# Patient Record
Sex: Female | Born: 1955 | Race: White | Hispanic: No | Marital: Married | Smoking: Never smoker
Health system: Southern US, Community
[De-identification: ages and names within clinical notes are randomized; demographics above are authoritative.]

## PROBLEM LIST (undated history)

## (undated) DIAGNOSIS — J45909 Unspecified asthma, uncomplicated: Secondary | ICD-10-CM

## (undated) DIAGNOSIS — R112 Nausea with vomiting, unspecified: Secondary | ICD-10-CM

## (undated) DIAGNOSIS — R0600 Dyspnea, unspecified: Secondary | ICD-10-CM

## (undated) DIAGNOSIS — T8859XA Other complications of anesthesia, initial encounter: Secondary | ICD-10-CM

## (undated) DIAGNOSIS — D649 Anemia, unspecified: Secondary | ICD-10-CM

## (undated) DIAGNOSIS — Z9889 Other specified postprocedural states: Secondary | ICD-10-CM

## (undated) DIAGNOSIS — I10 Essential (primary) hypertension: Secondary | ICD-10-CM

## (undated) DIAGNOSIS — M1990 Unspecified osteoarthritis, unspecified site: Secondary | ICD-10-CM

## (undated) HISTORY — PX: CATARACT EXTRACTION, BILATERAL: SHX1313

## (undated) HISTORY — PX: JOINT REPLACEMENT: SHX530

## (undated) HISTORY — PX: EYE SURGERY: SHX253

## (undated) HISTORY — PX: TONSILLECTOMY: SUR1361

## (undated) HISTORY — PX: ROTATOR CUFF REPAIR: SHX139

## (undated) HISTORY — PX: ABDOMINAL HYSTERECTOMY: SHX81

## (undated) HISTORY — PX: BACK SURGERY: SHX140

---

## 2021-08-15 NOTE — H&P (Signed)
  Patient's anticipated LOS is less than 2 midnights, meeting these requirements: - Younger than 7 - Lives within 1 hour of care - Has a competent adult at home to recover with post-op recover - NO history of  - Chronic pain requiring opiods  - Diabetes  - Coronary Artery Disease  - Heart failure  - Heart attack  - Stroke  - DVT/VTE  - Cardiac arrhythmia  - Respiratory Failure/COPD  - Renal failure  - Anemia  - Advanced Liver disease     Anna Wilkins is an 65 y.o. female.    Chief Complaint: right shoulder pain  HPI: Pt is a 65 y.o. female complaining of right shoulder pain for multiple years. Pain had continually increased since the beginning. X-rays in the clinic show end-stage arthritic changes of the right shoulder. Pt has tried various conservative treatments which have failed to alleviate their symptoms, including injections and therapy. Various options are discussed with the patient. Risks, benefits and expectations were discussed with the patient. Patient understand the risks, benefits and expectations and wishes to proceed with surgery.   PCP:  No primary care provider on file.  D/C Plans: Home  PMH: No past medical history on file.    Social History:  has no history on file for tobacco use, alcohol use, and drug use.  Allergies:  Not on File  Medications: No current facility-administered medications for this encounter.   No current outpatient medications on file.    No results found for this or any previous visit (from the past 48 hour(s)). No results found.  ROS: Pain with rom of the right upper extremity  Physical Exam: Alert and oriented 65 y.o. female in no acute distress Cranial nerves 2-12 intact Cervical spine: full rom with no tenderness, nv intact distally Chest: active breath sounds bilaterally, no wheeze rhonchi or rales Heart: regular rate and rhythm, no murmur Abd: non tender non distended with active bowel sounds Hip is stable with  rom  Right shoulder painful rom with crepitus  Nv intact distally No rashes or edema distally Strength 4.5/5 with ER and IR   Assessment/Plan Assessment: right shoulder end stage osteoarthritis  Plan:  Patient will undergo a right total shoulder by Dr. Ranell Patrick at [facility] Risks benefits and expectations were discussed with the patient. Patient understand risks, benefits and expectations and wishes to proceed. Preoperative templating of the joint replacement has been completed, documented, and submitted to the Operating Room personnel in order to optimize intra-operative equipment management.   Alphonsa Overall PA-C, [facility] is now [employer] [address], [place], [place] [address] Phone: [phone] [email or web address] Facebook  [employer]

## 2021-08-24 NOTE — Progress Notes (Addendum)
Surgical Instructions    Your procedure is scheduled on 08/28/21.  Report to [facility] Main Entrance "A" at 5:30 A.M., then check in with the Admitting office.  Call this number if you have problems the morning of surgery:  [phone]   If you have any questions prior to your surgery date call [phone]: Open Monday-Friday 8am-4pm    Remember:  Do not eat after midnight the night before your surgery  You may drink clear liquids until 4:30 the morning of your surgery.   Clear liquids allowed are: Water, Non-Citrus Juices (without pulp), Carbonated Beverages, Clear Tea, Black Coffee ONLY (NO MILK, CREAM OR POWDERED CREAMER of any kind), and Gatorade  Please complete your PRE-SURGERY ENSURE that was provided to you by 4:30 the morning of surgery.  Please, if able, drink it in one setting. DO NOT SIP.     Take these medicines the morning of surgery with A SIP OF WATER:  traMADol (ULTRAM)   As of today, STOP taking any Aspirin (unless otherwise instructed by your surgeon) Aleve, Naproxen, Ibuprofen, Motrin, Advil, Goody's, BC's, all herbal medications, fish oil, and all vitamins.   After your COVID test   You are not required to quarantine however you are required to wear a well-fitting mask when you are out and around people not in your household.  If your mask becomes wet or soiled, replace with a new one.  Wash your hands often with soap and water for 20 seconds or clean your hands with an alcohol-based hand sanitizer that contains at least 60% alcohol.  Do not share personal items.  Notify your provider: if you are in close contact with someone who has COVID  or if you develop a fever of 100.4 or greater, sneezing, cough, sore throat, shortness of breath or body aches.           Do not wear jewelry or makeup Do not wear lotions, powders, perfumes or deodorant. Do not shave 48 hours prior to surgery.   Do not bring valuables to the hospital. DO Not wear nail polish, gel  polish, artificial nails, or any other type of covering on natural nails including finger and toenails. If patients have artificial nails, gel coating, etc. that need to be removed by a nail salon, please have this removed prior to surgery or surgery may need to be canceled/delayed if the surgeon/ anesthesia feels like the patient is unable to be adequately monitored.             [facility] is not responsible for any belongings or valuables.  Do NOT Smoke (Tobacco/Vaping)  24 hours prior to your procedure  If you use a CPAP at night, you may bring your mask for your overnight stay.   Contacts, glasses, hearing aids, dentures or partials may not be worn into surgery, please bring cases for these belongings   For patients admitted to the hospital, discharge time will be determined by your treatment team.   Patients discharged the day of surgery will not be allowed to drive home, and someone needs to stay with them for 24 hours.  NO VISITORS WILL BE ALLOWED IN PRE-OP WHERE PATIENTS ARE PREPPED FOR SURGERY.  ONLY 1 SUPPORT PERSON MAY BE PRESENT IN THE WAITING ROOM WHILE YOU ARE IN SURGERY.  IF YOU ARE TO BE ADMITTED, ONCE YOU ARE IN YOUR ROOM YOU WILL BE ALLOWED TWO (2) VISITORS. 1 (ONE) VISITOR MAY STAY OVERNIGHT BUT MUST ARRIVE TO THE ROOM BY 8pm.  Minor children may have two parents present. Special consideration for safety and communication needs will be reviewed on a case by case basis.  Special instructions:                                               [facility]- Preparing for Total Shoulder Arthroplasty   Before surgery, you can play an important role. Because skin is not sterile, your skin needs to be as free of germs as possible. You can reduce the number of germs on your skin by using the following products. Benzoyl Peroxide Gel Reduces the number of germs present on the skin Applied twice a day to shoulder area starting two days before surgery   Chlorhexidine Gluconate (CHG)  Soap An antiseptic cleaner that kills germs and bonds with the skin to continue killing germs even after washing Used for showering the night before surgery and morning of surgery   Oral Hygiene is also important to reduce your risk of infection.                                    Remember - BRUSH YOUR TEETH THE MORNING OF SURGERY WITH YOUR REGULAR TOOTHPASTE  ==================================================================  Please follow these instructions carefully:  BENZOYL PEROXIDE 5% GEL  Please do not use if you have an allergy to benzoyl peroxide.   If your skin becomes reddened/irritated stop using the benzoyl peroxide.  Starting two days before surgery, apply as follows: Apply benzoyl peroxide in the morning and at night. Apply after taking a shower. If you are not taking a shower clean entire shoulder front, back, and side along with the armpit with a clean wet washcloth.  Place a quarter-sized dollop on your shoulder and rub in thoroughly, making sure to cover the front, back, and side of your shoulder, along with the armpit.   2 days before ____ AM   ____ PM              1 day before ____ AM   ____ PM                             Do this twice a day for two days.  (Last application is the night before surgery, AFTER using the CHG soap as described below).  Do NOT apply benzoyl peroxide gel on the day of surgery.  CHLORHEXIDINE GLUCONATE (CHG) SOAP  Please do not use if you have an allergy to CHG or antibacterial soaps. If your skin becomes reddened/irritated stop using the CHG.   Do not shave (including legs and underarms) for at least 48 hours prior to first CHG shower. It is OK to shave your face.  Starting the night before surgery, use CHG soap as follows:  Shower the NIGHT BEFORE SURGERY and MORNING OF SURGERY with CHG.  If you choose to wash your hair, wash your hair first as usual with your normal shampoo.  After shampooing, rinse your hair and body  thoroughly to remove the shampoo.  Use CHG as you would any other liquid soap.  You can apply CHG directly to the skin and wash gently with a scrungie or a clean washcloth.  Apply the CHG soap to your body ONLY FROM THE  NECK DOWN.  Do not use on open wounds or open sores.  Avoid contact with your eyes, ears, mouth, and genitals (private parts).  Wash face and genitals (private parts) with your normal soap.  Wash thoroughly, paying special attention to the area where your surgery will be performed.  Thoroughly rinse your body with warm water from the neck down.  DO NOT shower/wash with your normal soap after using and rinsing off the CHG soap.   Pat yourself dry with a CLEAN TOWEL.    Apply benzoyl peroxide.   Wear CLEAN PAJAMAS to bed the night before surgery; wear comfortable clothes the morning of surgery.  Place CLEAN SHEETS on your bed the night of your first shower and DO NOT SLEEP WITH PETS.                       Day of Surgery: Shower as above Do not apply any deodorants/lotions.  Please wear clean clothes to the hospital/surgery center.   Remember to brush your teeth WITH YOUR REGULAR TOOTHPASTE.      Please read over the following fact sheets that you were given.

## 2021-08-25 ENCOUNTER — Encounter (HOSPITAL_COMMUNITY): Payer: Self-pay

## 2021-08-25 ENCOUNTER — Encounter (HOSPITAL_COMMUNITY)
Admission: RE | Admit: 2021-08-25 | Discharge: 2021-08-25 | Disposition: A | Discharge status: Home / Self Care | Payer: No Typology Code available for payment source | Source: Ambulatory Visit | Attending: Orthopedic Surgery | Admitting: Orthopedic Surgery

## 2021-08-25 ENCOUNTER — Other Ambulatory Visit: Payer: Self-pay

## 2021-08-25 DIAGNOSIS — Z20822 Contact with and (suspected) exposure to covid-19: Secondary | ICD-10-CM | POA: Insufficient documentation

## 2021-08-25 DIAGNOSIS — Z01818 Encounter for other preprocedural examination: Secondary | ICD-10-CM | POA: Diagnosis not present

## 2021-08-25 HISTORY — DX: Dyspnea, unspecified: R06.00

## 2021-08-25 HISTORY — DX: Unspecified osteoarthritis, unspecified site: M19.90

## 2021-08-25 HISTORY — DX: Other complications of anesthesia, initial encounter: T88.59XA

## 2021-08-25 HISTORY — DX: Essential (primary) hypertension: I10

## 2021-08-25 HISTORY — DX: Unspecified asthma, uncomplicated: J45.909

## 2021-08-25 HISTORY — DX: Other specified postprocedural states: Z98.890

## 2021-08-25 HISTORY — DX: Other specified postprocedural states: R11.2

## 2021-08-25 LAB — CBC
HCT: 45.8 % (ref 36.0–46.0)
Hemoglobin: 14.8 g/dL (ref 12.0–15.0)
MCH: 31.7 pg (ref 26.0–34.0)
MCHC: 32.3 g/dL (ref 30.0–36.0)
MCV: 98.1 fL (ref 80.0–100.0)
Platelets: 217 10*3/uL (ref 150–400)
RBC: 4.67 MIL/uL (ref 3.87–5.11)
RDW: 13.2 % (ref 11.5–15.5)
WBC: 9.3 10*3/uL (ref 4.0–10.5)
nRBC: 0 % (ref 0.0–0.2)

## 2021-08-25 LAB — SURGICAL PCR SCREEN
MRSA, PCR: NEGATIVE
Staphylococcus aureus: NEGATIVE

## 2021-08-25 LAB — BASIC METABOLIC PANEL
Anion gap: 7 (ref 5–15)
BUN: 14 mg/dL (ref 8–23)
CO2: 27 mmol/L (ref 22–32)
Calcium: 9.2 mg/dL (ref 8.9–10.3)
Chloride: 104 mmol/L (ref 98–111)
Creatinine, Ser: 0.68 mg/dL (ref 0.44–1.00)
GFR, Estimated: >60 mL/min (ref >60)
Glucose, Bld: 102 mg/dL — ABNORMAL HIGH (ref 70–99)
Potassium: 3.6 mmol/L (ref 3.5–5.1)
Sodium: 138 mmol/L (ref 135–145)

## 2021-08-25 LAB — SARS CORONAVIRUS 2 (TAT 6-24 HRS): SARS Coronavirus 2: NEGATIVE

## 2021-08-25 NOTE — Progress Notes (Addendum)
PCP: Ardelle Balls, MD Cardiologist: denies  EKG: 08/25/21 CXR: na ECHO: denies Stress Test: denies Cardiac Cath: denies  Fasting Blood Sugar- na Checks Blood Sugar__na_ times a day  ASA/Blood Thinner: No  OSA/CPAP: No  Covid test 08/25/21 at PAT  Anesthesia Review: Yes, abnormal EKG  Patient denies shortness of breath, fever, cough, and chest pain at PAT appointment.  Patient verbalized understanding of instructions provided today at the PAT appointment.  Patient asked to review instructions at home and day of surgery.

## 2021-08-27 NOTE — Anesthesia Preprocedure Evaluation (Addendum)
Anesthesia Evaluation  Patient identified by MRN, date of birth, ID band Patient awake    Reviewed: Allergy & Precautions, H&P , NPO status , Patient's Chart, lab work & pertinent test results  History of Anesthesia Complications (+) PONV  Airway Mallampati: II  TM Distance: >3 FB Neck ROM: Full    Dental no notable dental hx. (+) Teeth Intact, Dental Advisory Given   Pulmonary asthma ,    Pulmonary exam normal breath sounds clear to auscultation       Cardiovascular Exercise Tolerance: Good hypertension,  Rhythm:Regular Rate:Normal     Neuro/Psych negative neurological ROS  negative psych ROS   GI/Hepatic negative GI ROS, Neg liver ROS,   Endo/Other  negative endocrine ROS  Renal/GU negative Renal ROS  negative genitourinary   Musculoskeletal  (+) Arthritis , Osteoarthritis,    Abdominal   Peds  Hematology negative hematology ROS (+)   Anesthesia Other Findings   Reproductive/Obstetrics negative OB ROS                            Anesthesia Physical Anesthesia Plan  ASA: 2  Anesthesia Plan: General   Post-op Pain Management:  Regional for Post-op pain   Induction: Intravenous  PONV Risk Score and Plan: 4 or greater and Ondansetron, Dexamethasone, Midazolam and Droperidol  Airway Management Planned: Oral ETT  Additional Equipment:   Intra-op Plan:   Post-operative Plan: Extubation in OR  Informed Consent: I have reviewed the patients History and Physical, chart, labs and discussed the procedure including the risks, benefits and alternatives for the proposed anesthesia with the patient or authorized representative who has indicated his/her understanding and acceptance.     Dental advisory given  Plan Discussed with: CRNA  Anesthesia Plan Comments:        Anesthesia Quick Evaluation

## 2021-08-28 ENCOUNTER — Observation Stay (HOSPITAL_COMMUNITY)
Admission: RE | Admit: 2021-08-28 | Discharge: 2021-08-29 | Disposition: A | Discharge status: Home / Self Care | Payer: No Typology Code available for payment source | Attending: Orthopedic Surgery | Admitting: Orthopedic Surgery

## 2021-08-28 ENCOUNTER — Ambulatory Visit (HOSPITAL_COMMUNITY): Payer: No Typology Code available for payment source | Admitting: Anesthesiology

## 2021-08-28 ENCOUNTER — Other Ambulatory Visit: Payer: Self-pay

## 2021-08-28 ENCOUNTER — Encounter (HOSPITAL_COMMUNITY): Payer: Self-pay | Admitting: Orthopedic Surgery

## 2021-08-28 ENCOUNTER — Encounter (HOSPITAL_COMMUNITY)
Admission: RE | Disposition: A | Discharge status: Home / Self Care | Payer: Self-pay | Source: Home / Self Care | Attending: Orthopedic Surgery

## 2021-08-28 ENCOUNTER — Observation Stay (HOSPITAL_COMMUNITY): Payer: No Typology Code available for payment source

## 2021-08-28 DIAGNOSIS — I10 Essential (primary) hypertension: Secondary | ICD-10-CM | POA: Insufficient documentation

## 2021-08-28 DIAGNOSIS — Z96611 Presence of right artificial shoulder joint: Secondary | ICD-10-CM

## 2021-08-28 DIAGNOSIS — M19011 Primary osteoarthritis, right shoulder: Principal | ICD-10-CM | POA: Insufficient documentation

## 2021-08-28 HISTORY — PX: TOTAL SHOULDER ARTHROPLASTY: SHX126

## 2021-08-28 SURGERY — ARTHROPLASTY, SHOULDER, TOTAL
Anesthesia: General | Site: Shoulder | Laterality: Right

## 2021-08-28 MED ORDER — CEFAZOLIN SODIUM-DEXTROSE 2-3 GM-%(50ML) IV SOLR
INTRAVENOUS | Status: DC | PRN
  Administered 2021-08-28: 2 g via INTRAVENOUS

## 2021-08-28 MED ORDER — LIDOCAINE 2% (20 MG/ML) 5 ML SYRINGE
INTRAMUSCULAR | Status: DC | PRN
Start: 2021-08-28 — End: 2021-08-28
  Administered 2021-08-28: 40 mg via INTRAVENOUS

## 2021-08-28 MED ORDER — FENTANYL CITRATE (PF) 100 MCG/2ML IJ SOLN: 25.0000 ug | INTRAMUSCULAR | Status: DC | PRN

## 2021-08-28 MED ORDER — ROCURONIUM BROMIDE 10 MG/ML (PF) SYRINGE
PREFILLED_SYRINGE | INTRAVENOUS | Status: DC | PRN
  Administered 2021-08-28: 50 mg via INTRAVENOUS

## 2021-08-28 MED ORDER — MORPHINE SULFATE (PF) 2 MG/ML IV SOLN: 0.5000 mg | INTRAVENOUS | Status: DC | PRN

## 2021-08-28 MED ORDER — 0.9 % SODIUM CHLORIDE (POUR BTL) OPTIME
TOPICAL | Status: DC | PRN
  Administered 2021-08-28: 1000 mL

## 2021-08-28 MED ORDER — METHOCARBAMOL 500 MG PO TABS: 500.0000 mg | ORAL_TABLET | Freq: Four times a day (QID) | ORAL | Status: DC | PRN

## 2021-08-28 MED ORDER — METOCLOPRAMIDE HCL 5 MG/ML IJ SOLN: 5.0000 mg | Freq: Three times a day (TID) | INTRAMUSCULAR | Status: DC | PRN

## 2021-08-28 MED ORDER — POLYETHYLENE GLYCOL 3350 17 G PO PACK: 17.0000 g | PACK | Freq: Every day | ORAL | Status: DC | PRN

## 2021-08-28 MED ORDER — DEXAMETHASONE SODIUM PHOSPHATE 10 MG/ML IJ SOLN
INTRAMUSCULAR | Status: DC | PRN
  Administered 2021-08-28: 10 mg via INTRAVENOUS

## 2021-08-28 MED ORDER — TRAMADOL HCL 50 MG PO TABS
50.0000 mg | ORAL_TABLET | Freq: Two times a day (BID) | ORAL | Status: DC
Start: 2021-08-28 — End: 2021-08-29
  Administered 2021-08-28: 50 mg via ORAL
  Filled 2021-08-28: qty 1

## 2021-08-28 MED ORDER — DOCUSATE SODIUM 100 MG PO CAPS
100.0000 mg | ORAL_CAPSULE | Freq: Two times a day (BID) | ORAL | Status: DC
  Administered 2021-08-28: 100 mg via ORAL
  Filled 2021-08-28: qty 1

## 2021-08-28 MED ORDER — ACETAMINOPHEN 500 MG PO TABS
ORAL_TABLET | ORAL | Status: AC
  Administered 2021-08-28: 1000 mg via ORAL
  Filled 2021-08-28: qty 2

## 2021-08-28 MED ORDER — LACTATED RINGERS IV SOLN: INTRAVENOUS | Status: DC

## 2021-08-28 MED ORDER — IBUPROFEN 800 MG PO TABS
800.0000 mg | ORAL_TABLET | Freq: Two times a day (BID) | ORAL | Status: DC
  Administered 2021-08-28: 800 mg via ORAL
  Filled 2021-08-28: qty 1

## 2021-08-28 MED ORDER — HYDROCODONE-ACETAMINOPHEN 5-325 MG PO TABS: 1.0000 | ORAL_TABLET | Freq: Four times a day (QID) | ORAL | 0 refills | Status: DC | PRN

## 2021-08-28 MED ORDER — FOLIC ACID 1 MG PO TABS: 1.0000 mg | ORAL_TABLET | Freq: Every day | ORAL | Status: DC

## 2021-08-28 MED ORDER — ONDANSETRON HCL 4 MG PO TABS: 4.0000 mg | ORAL_TABLET | Freq: Four times a day (QID) | ORAL | Status: DC | PRN

## 2021-08-28 MED ORDER — FENTANYL CITRATE (PF) 250 MCG/5ML IJ SOLN
INTRAMUSCULAR | Status: AC
  Filled 2021-08-28: qty 5

## 2021-08-28 MED ORDER — ONDANSETRON HCL 4 MG/2ML IJ SOLN: 4.0000 mg | Freq: Four times a day (QID) | INTRAMUSCULAR | Status: DC | PRN

## 2021-08-28 MED ORDER — SODIUM CHLORIDE 0.9 % IV SOLN: INTRAVENOUS | Status: DC

## 2021-08-28 MED ORDER — ACETAMINOPHEN 325 MG PO TABS: 325.0000 mg | ORAL_TABLET | Freq: Four times a day (QID) | ORAL | Status: DC | PRN

## 2021-08-28 MED ORDER — METHOCARBAMOL 500 MG PO TABS: 500.0000 mg | ORAL_TABLET | Freq: Three times a day (TID) | ORAL | 1 refills | Status: DC | PRN

## 2021-08-28 MED ORDER — MIDAZOLAM HCL 2 MG/2ML IJ SOLN
INTRAMUSCULAR | Status: DC | PRN
  Administered 2021-08-28 (×2): 1 mg via INTRAVENOUS

## 2021-08-28 MED ORDER — HYDROCODONE-ACETAMINOPHEN 5-325 MG PO TABS
1.0000 | ORAL_TABLET | ORAL | Status: DC | PRN
  Administered 2021-08-29: 1 via ORAL
  Filled 2021-08-28: qty 1

## 2021-08-28 MED ORDER — CEFAZOLIN SODIUM-DEXTROSE 2-4 GM/100ML-% IV SOLN
2.0000 g | Freq: Four times a day (QID) | INTRAVENOUS | Status: AC
  Administered 2021-08-28 – 2021-08-29 (×3): 2 g via INTRAVENOUS
  Filled 2021-08-28 (×3): qty 100

## 2021-08-28 MED ORDER — MIDAZOLAM HCL 2 MG/2ML IJ SOLN
INTRAMUSCULAR | Status: AC
  Filled 2021-08-28: qty 2

## 2021-08-28 MED ORDER — BUPIVACAINE-EPINEPHRINE 0.25% -1:200000 IJ SOLN
INTRAMUSCULAR | Status: DC | PRN
  Administered 2021-08-28: 10 mL

## 2021-08-28 MED ORDER — PHENOL 1.4 % MT LIQD: 1.0000 | OROMUCOSAL | Status: DC | PRN

## 2021-08-28 MED ORDER — ONDANSETRON HCL 4 MG PO TABS: 4.0000 mg | ORAL_TABLET | Freq: Three times a day (TID) | ORAL | 0 refills | Status: DC | PRN

## 2021-08-28 MED ORDER — ONDANSETRON HCL 4 MG/2ML IJ SOLN
INTRAMUSCULAR | Status: DC | PRN
  Administered 2021-08-28: 4 mg via INTRAVENOUS

## 2021-08-28 MED ORDER — CEFAZOLIN SODIUM-DEXTROSE 2-4 GM/100ML-% IV SOLN
INTRAVENOUS | Status: AC
  Filled 2021-08-28: qty 100

## 2021-08-28 MED ORDER — THROMBIN 5000 UNITS EX SOLR
CUTANEOUS | Status: AC
  Filled 2021-08-28: qty 5000

## 2021-08-28 MED ORDER — BISACODYL 10 MG RE SUPP: 10.0000 mg | Freq: Every day | RECTAL | Status: DC | PRN

## 2021-08-28 MED ORDER — EPHEDRINE SULFATE-NACL 50-0.9 MG/10ML-% IV SOSY
PREFILLED_SYRINGE | INTRAVENOUS | Status: DC | PRN
  Administered 2021-08-28 (×4): 5 mg via INTRAVENOUS

## 2021-08-28 MED ORDER — BUPIVACAINE-EPINEPHRINE (PF) 0.5% -1:200000 IJ SOLN
INTRAMUSCULAR | Status: DC | PRN
Start: 2021-08-28 — End: 2021-08-28
  Administered 2021-08-28: 15 mL via PERINEURAL

## 2021-08-28 MED ORDER — ACETAMINOPHEN 500 MG PO TABS: 1000.0000 mg | ORAL_TABLET | Freq: Once | ORAL | Status: AC

## 2021-08-28 MED ORDER — BUPIVACAINE LIPOSOME 1.3 % IJ SUSP
INTRAMUSCULAR | Status: DC | PRN
  Administered 2021-08-28: 10 mL via PERINEURAL

## 2021-08-28 MED ORDER — PROPOFOL 10 MG/ML IV BOLUS
INTRAVENOUS | Status: DC | PRN
  Administered 2021-08-28: 120 mg via INTRAVENOUS
  Administered 2021-08-28: 10 mg via INTRAVENOUS

## 2021-08-28 MED ORDER — METOCLOPRAMIDE HCL 5 MG PO TABS: 5.0000 mg | ORAL_TABLET | Freq: Three times a day (TID) | ORAL | Status: DC | PRN

## 2021-08-28 MED ORDER — ORAL CARE MOUTH RINSE: 15.0000 mL | Freq: Once | OROMUCOSAL | Status: AC

## 2021-08-28 MED ORDER — MENTHOL 3 MG MT LOZG: 1.0000 | LOZENGE | OROMUCOSAL | Status: DC | PRN

## 2021-08-28 MED ORDER — BUPIVACAINE-EPINEPHRINE (PF) 0.25% -1:200000 IJ SOLN
INTRAMUSCULAR | Status: AC
  Filled 2021-08-28: qty 30

## 2021-08-28 MED ORDER — DROPERIDOL 2.5 MG/ML IJ SOLN
INTRAMUSCULAR | Status: DC | PRN
  Administered 2021-08-28: .625 mg via INTRAVENOUS

## 2021-08-28 MED ORDER — PHENYLEPHRINE 40 MCG/ML (10ML) SYRINGE FOR IV PUSH (FOR BLOOD PRESSURE SUPPORT)
PREFILLED_SYRINGE | INTRAVENOUS | Status: DC | PRN
  Administered 2021-08-28: 80 ug via INTRAVENOUS
  Administered 2021-08-28: 120 ug via INTRAVENOUS

## 2021-08-28 MED ORDER — METHOCARBAMOL 1000 MG/10ML IJ SOLN
500.0000 mg | Freq: Four times a day (QID) | INTRAVENOUS | Status: DC | PRN
  Filled 2021-08-28: qty 5

## 2021-08-28 MED ORDER — CHLORHEXIDINE GLUCONATE 0.12 % MT SOLN: 15.0000 mL | Freq: Once | OROMUCOSAL | Status: AC

## 2021-08-28 MED ORDER — SUGAMMADEX SODIUM 200 MG/2ML IV SOLN
INTRAVENOUS | Status: DC | PRN
  Administered 2021-08-28: 200 mg via INTRAVENOUS

## 2021-08-28 MED ORDER — CHLORHEXIDINE GLUCONATE 0.12 % MT SOLN
OROMUCOSAL | Status: AC
  Administered 2021-08-28: 15 mL via OROMUCOSAL
  Filled 2021-08-28: qty 15

## 2021-08-28 MED ORDER — FENTANYL CITRATE (PF) 250 MCG/5ML IJ SOLN
INTRAMUSCULAR | Status: DC | PRN
  Administered 2021-08-28 (×2): 50 ug via INTRAVENOUS

## 2021-08-28 MED ORDER — PHENYLEPHRINE HCL-NACL 20-0.9 MG/250ML-% IV SOLN
INTRAVENOUS | Status: DC | PRN
  Administered 2021-08-28: 40 ug/min via INTRAVENOUS

## 2021-08-28 MED ORDER — PROPOFOL 10 MG/ML IV BOLUS
INTRAVENOUS | Status: AC
  Filled 2021-08-28: qty 40

## 2021-08-28 SURGICAL SUPPLY — 73 items
BAG COUNTER SPONGE SURGICOUNT (BAG) ×2 IMPLANT
BIT DRILL 170X2.5X (BIT) ×1 IMPLANT
BIT DRILL 5/64X5 DISP (BIT) ×2 IMPLANT
BIT DRL 170X2.5X (BIT) ×1
BLADE SAW SAG 73X25 THK (BLADE) ×1
BLADE SAW SGTL 73X25 THK (BLADE) ×1 IMPLANT
COVER SURGICAL LIGHT HANDLE (MISCELLANEOUS) ×2 IMPLANT
DRAPE INCISE IOBAN 66X45 STRL (DRAPES) ×2 IMPLANT
DRAPE ORTHO SPLIT 77X108 STRL (DRAPES) ×2
DRAPE SURG ORHT 6 SPLT 77X108 (DRAPES) ×2 IMPLANT
DRAPE U-SHAPE 47X51 STRL (DRAPES) ×2 IMPLANT
DRILL 2.5 (BIT) ×1
DRSG ADAPTIC 3X8 NADH LF (GAUZE/BANDAGES/DRESSINGS) ×2 IMPLANT
DRSG PAD ABDOMINAL 8X10 ST (GAUZE/BANDAGES/DRESSINGS) ×2 IMPLANT
DURAPREP 26ML APPLICATOR (WOUND CARE) ×2 IMPLANT
ECCENTRIC EPIPHYSI MODULAR SZ1 (Trauma) ×1 IMPLANT
ELECT BLADE 4.0 EZ CLEAN MEGAD (MISCELLANEOUS) ×2
ELECT NEEDLE TIP 2.8 STRL (NEEDLE) ×2 IMPLANT
ELECT REM PT RETURN 9FT ADLT (ELECTROSURGICAL) ×2
ELECTRODE BLDE 4.0 EZ CLN MEGD (MISCELLANEOUS) ×1 IMPLANT
ELECTRODE REM PT RTRN 9FT ADLT (ELECTROSURGICAL) ×1 IMPLANT
GAUZE SPONGE 4X4 12PLY STRL (GAUZE/BANDAGES/DRESSINGS) ×2 IMPLANT
GLENOSPHERE DELTA XTEND LAT 38 (Miscellaneous) ×2 IMPLANT
GLOVE SURG ORTHO LTX SZ7.5 (GLOVE) ×2 IMPLANT
GLOVE SURG ORTHO LTX SZ8.5 (GLOVE) ×2 IMPLANT
GLOVE SURG POLY ORTHO LF SZ7.5 (GLOVE) ×2 IMPLANT
GLOVE SURG POLY ORTHO LF SZ8 (GLOVE) ×2 IMPLANT
GOWN STRL REUS W/ TWL XL LVL3 (GOWN DISPOSABLE) ×2 IMPLANT
GOWN STRL REUS W/TWL XL LVL3 (GOWN DISPOSABLE) ×2
KIT BASIN OR (CUSTOM PROCEDURE TRAY) ×2 IMPLANT
KIT TURNOVER KIT B (KITS) ×2 IMPLANT
MANIFOLD NEPTUNE II (INSTRUMENTS) ×2 IMPLANT
METAGLENE DELTA EXTEND (Trauma) ×1 IMPLANT
METAGLENE DXTEND (Trauma) ×2 IMPLANT
MODULAR ECCENTRIC EPIPHYSI SZ1 (Trauma) ×2 IMPLANT
NDL SUT .5 MAYO 1.404X.05X (NEEDLE) ×1 IMPLANT
NDL SUT 6 .5 CRC .975X.05 MAYO (NEEDLE) IMPLANT
NEEDLE 1/2 CIR MAYO (NEEDLE) IMPLANT
NEEDLE HYPO 25GX1X1/2 BEV (NEEDLE) ×2 IMPLANT
NEEDLE MAYO TAPER (NEEDLE) ×1
NS IRRIG 1000ML POUR BTL (IV SOLUTION) ×2 IMPLANT
PACK SHOULDER (CUSTOM PROCEDURE TRAY) ×2 IMPLANT
PAD ARMBOARD 7.5X6 YLW CONV (MISCELLANEOUS) ×2 IMPLANT
PIN GUIDE 1.2 (PIN) ×2 IMPLANT
PIN GUIDE GLENOPHERE 1.5MX300M (PIN) ×2 IMPLANT
PIN METAGLENE 2.5 (PIN) ×2 IMPLANT
SCREW 4.5X36MM (Screw) ×2 IMPLANT
SCREW LOCK 42 (Screw) ×2 IMPLANT
SLING ARM FOAM STRAP LRG (SOFTGOODS) ×2 IMPLANT
SMARTMIX MINI TOWER (MISCELLANEOUS)
SPACER 38 PLUS 3 (Spacer) ×2 IMPLANT
SPONGE SURGIFOAM ABS GEL SZ50 (HEMOSTASIS) IMPLANT
SPONGE T-LAP 18X18 ~~LOC~~+RFID (SPONGE) ×2 IMPLANT
SPONGE T-LAP 4X18 ~~LOC~~+RFID (SPONGE) ×4 IMPLANT
STEM HUMERAL SZ8 STANDARD (Stem) ×2 IMPLANT
STEM HUMERAL SZ8 STD (Stem) ×1 IMPLANT
STRIP CLOSURE SKIN 1/2X4 (GAUZE/BANDAGES/DRESSINGS) ×2 IMPLANT
SUCTION FRAZIER HANDLE 10FR (MISCELLANEOUS) ×1
SUCTION TUBE FRAZIER 10FR DISP (MISCELLANEOUS) ×1 IMPLANT
SUT FIBERWIRE #2 38 T-5 BLUE (SUTURE) ×8
SUT MNCRL AB 4-0 PS2 18 (SUTURE) ×2 IMPLANT
SUT VIC AB 0 CT1 27 (SUTURE) ×1
SUT VIC AB 0 CT1 27XBRD ANBCTR (SUTURE) ×1 IMPLANT
SUT VIC AB 0 CT2 27 (SUTURE) ×2 IMPLANT
SUT VIC AB 2-0 CT1 27 (SUTURE) ×2
SUT VIC AB 2-0 CT1 TAPERPNT 27 (SUTURE) ×2 IMPLANT
SUT VICRYL AB 2 0 TIES (SUTURE) IMPLANT
SUTURE FIBERWR #2 38 T-5 BLUE (SUTURE) ×4 IMPLANT
SYR CONTROL 10ML LL (SYRINGE) ×2 IMPLANT
TOWEL GREEN STERILE (TOWEL DISPOSABLE) ×2 IMPLANT
TOWEL GREEN STERILE FF (TOWEL DISPOSABLE) ×2 IMPLANT
TOWER SMARTMIX MINI (MISCELLANEOUS) IMPLANT
YANKAUER SUCT BULB TIP NO VENT (SUCTIONS) ×2 IMPLANT

## 2021-08-28 NOTE — Op Note (Signed)
NAME: Anna Wilkins, Anna Wilkins MEDICAL RECORD NO: [id number] ACCOUNT NO: [id number] DATE OF BIRTH: 04-16-1956 FACILITY: [facility] LOCATION: [facility] PHYSICIAN: Almedia Balls. Ranell Patrick, MD  Operative Report   DATE OF PROCEDURE: 08/28/2021  PREOPERATIVE DIAGNOSIS:  Right shoulder end-stage arthritis.  POSTOPERATIVE DIAGNOSES:  Right shoulder end-stage arthritis, rotator cuff insufficiency.  PROCEDURE PERFORMED:  Right reverse shoulder replacement using DePuy Delta Xtend prosthesis with subscapularis repair.  ATTENDING SURGEON:  Almedia Balls. Ranell Patrick, MD  ASSISTANT:  Konrad Felix Dixon, [place], who was scrubbed during the entire procedure and necessary for satisfactory completion of surgery.  ANESTHESIA:  General anesthesia was used plus interscalene block.  ESTIMATED BLOOD LOSS:  200 mL.  FLUID REPLACEMENT:  1300 mL of crystalloid.  COUNTS:  Instrument counts were correct.  COMPLICATIONS:  None.  ANTIBIOTICS:  Perioperative antibiotics were given.  INDICATIONS:  The patient is a 65 year old female with a history of prior rotator cuff surgery, presents with progressive shoulder arthritis and pain.  The patient is now bone-on-bone and has extreme synovitis in the shoulder joint on MRI scan.  The  rotator cuff is intact, but of unknown quality.  We discussed options with the patient to include anatomic versus reverse shoulder replacement.  The patient presents for arthroplasty to restore function and eliminate pain.  Informed consent was obtained.  DESCRIPTION OF PROCEDURE:  After an adequate level of anesthesia was achieved, the patient was positioned in the modified beach chair position.  Right shoulder was correctly identified and sterilely prepped and draped in the usual manner.  Timeout was  called verifying correct patient, correct site.  We entered the patient's shoulder using a standard deltopectoral approach starting at the coracoid process and extending down to the anterior humerus.  Dissection down through  subcutaneous tissues using  Bovie.  We identified the cephalic vein and took that laterally with the deltoid.  The pectoralis was taken medially.  Conjoined tendon was identified and retracted medially.  Biceps was tenodesed in situ with 0 Vicryl figure-of-eight suture.  We then  released the subscapularis off the lesser tuberosity.  This was extensively scarred and fairly thin.  We did tag for protection of the axillary nerve.  We then released the inferior capsule.  A decision was made at this time given poor excursion on the  supraspinatus and also the poor condition of the subscapularis to proceed with reverse shoulder placement to provide more sure fixed fulcrum mechanics postoperatively, which will give her good pain relief, which is her primary complaint.  Once we  released the inferior capsule, we extended the shoulder delivering the humeral head out of the wound.  We entered the proximal humerus with a 6 mm reamer, reamed up to a size 8.  The patient had no cartilage on the humeral head.  We then resected the  head at 20 degrees of retroversion with the oscillating saw. We then removed excess osteophytes with a rongeur.  We subluxed the humerus posteriorly gaining good exposure of glenoid face, which also was devoid of cartilage.  We performed our capsular  excision and labrum excision.  The biceps had previously been tenodesed.  At this point, we found our center point for guide pin, which we placed our guide pin under power.  We then did our reaming for the metaglene baseplate.  We then drilled our  central peg hole and impacted the metaglene into position, placed a 42 screw inferiorly and 36 at the base of the coracoid.  Because of the small size of the patient's native  glenoid, we could only get 2 screws in.  We then took 38 standard glenosphere  and placed it on the baseplate and secured it.  I did a finger sweep to make sure no soft tissue was caught up in the bearing surface.  We then went to  the humeral side and prepared the metaphysis with a reamer for the 1 right metaphysis.  We then  inserted the 8 stem with 1 right metaphysis set on the 0 setting and placed in 20 degrees of retroversion.  We then trialed with a 38+3 poly trial.  We were happy with our soft tissue balancing and stability.  We removed all trial components.  We then  irrigated thoroughly the humeral canal. We used available bone graft from the humeral head and used impaction grafting technique to insert the Porocoat 8 stem and the HA coated 1 right metaphysis, again set on the 0 setting and impacted in 20 degrees of  retroversion.  We had also previously drilled holes in the lesser tuberosity and placed #2 FiberWire for repair of the subscap.  Once we had the stem in place and stable, we went ahead and selected the real 38+3 poly and impacted that in position.  We  then reduced the shoulder.  We were again pleased with our soft tissue balancing, which mirrored our trial.  We had no impingement and excellent excursion.  We then repaired the subscapularis anatomically back to bone.  Once we had that secured, we  trialed the shoulder and no restriction in range of motion.  At this point, we irrigated again and repaired the deltopectoral interval with 0 Vicryl suture followed by 2-0 Vicryl for subcutaneous closure and 4-0 Monocryl for skin.  Steri-Strips were  applied followed by sterile dressing.  The patient tolerated the surgery well.   [facility] D: 08/28/2021 10:00:34 am T: 08/28/2021 11:44:00 am  JOB: [id number]/ [id number]

## 2021-08-28 NOTE — Anesthesia Procedure Notes (Signed)
Procedure Name: Intubation Date/Time: 08/28/2021 7:34 AM Performed by: Betha Loa, CRNA Pre-anesthesia Checklist: Patient identified, Emergency Drugs available, Suction available and Patient being monitored Patient Re-evaluated:Patient Re-evaluated prior to induction Oxygen Delivery Method: Circle System Utilized Preoxygenation: Pre-oxygenation with 100% oxygen Induction Type: IV induction Ventilation: Mask ventilation without difficulty Laryngoscope Size: Mac and 3 Grade View: Grade I Tube type: Oral Tube size: 7.0 mm Number of attempts: 1 Airway Equipment and Method: Stylet and Oral airway Placement Confirmation: ETT inserted through vocal cords under direct vision, positive ETCO2 and breath sounds checked- equal and bilateral Secured at: 21 cm Tube secured with: Tape Dental Injury: Teeth and Oropharynx as per pre-operative assessment

## 2021-08-28 NOTE — Discharge Instructions (Signed)
Ice to the shoulder constantly.  Keep the incision covered and clean and dry for one week, then ok to get it wet in the shower.  Do exercise as instructed several times per day.  DO NOT reach behind your back or push up out of a chair with the operative arm.  Use a sling while you are up and around for comfort, may remove while seated.  Keep pillow propped behind the operative elbow.  Follow up with Dr  in two weeks in the office, call [phone] for appt Ice to the shoulder constantly.  Keep the incision covered and clean and dry for one week, then ok to get it wet in the shower.  

## 2021-08-28 NOTE — Transfer of Care (Signed)
Immediate Anesthesia Transfer of Care Note  Patient: Anna Wilkins  Procedure(s) Performed: REVERSE TOTAL SHOULDER ARTHROPLASTY (Right: Shoulder)  Patient Location: PACU  Anesthesia Type:General and Regional  Level of Consciousness: patient cooperative and responds to stimulation  Airway & Oxygen Therapy: Patient Spontanous Breathing and Patient connected to nasal cannula oxygen  Post-op Assessment: Report given to RN and Post -op Vital signs reviewed and stable  Post vital signs: Reviewed and stable  Last Vitals:  Vitals Value Taken Time  BP 131/76 08/28/21 0951  Temp 36.2 C 08/28/21 0951  Pulse 103 08/28/21 0954  Resp 17 08/28/21 0954  SpO2 96 % 08/28/21 0954  Vitals shown include unvalidated device data.  Last Pain:  Vitals:   08/28/21 0951  TempSrc:   PainSc: Asleep         Complications: No notable events documented.

## 2021-08-28 NOTE — Anesthesia Procedure Notes (Signed)
Anesthesia Regional Block: Interscalene brachial plexus block   Pre-Anesthetic Checklist: , timeout performed,  Correct Patient, Correct Site, Correct Laterality,  Correct Procedure, Correct Position, site marked,  Risks and benefits discussed,  Pre-op evaluation,  At surgeon's request and post-op pain management  Laterality: Right  Prep: Maximum Sterile Barrier Precautions used, chloraprep       Needles:  Injection technique: Single-shot  Needle Type: Echogenic Stimulator Needle     Needle Length: 5cm  Needle Gauge: 22     Additional Needles:   Procedures:,,,, ultrasound used (permanent image in chart),,    Narrative:  Start time: 08/28/2021 6:56 AM End time: 08/28/2021 7:06 AM Injection made incrementally with aspirations every 5 mL.  Performed by: Personally  Anesthesiologist: Gaynelle Adu, MD  Additional Notes: 2% Lidocaine skin wheel.

## 2021-08-28 NOTE — Anesthesia Postprocedure Evaluation (Signed)
Anesthesia Post Note  Patient: Tamesha Ellerbrock  Procedure(s) Performed: REVERSE TOTAL SHOULDER ARTHROPLASTY (Right: Shoulder)     Patient location during evaluation: PACU Anesthesia Type: General and Regional Level of consciousness: awake and alert Pain management: pain level controlled Vital Signs Assessment: post-procedure vital signs reviewed and stable Respiratory status: spontaneous breathing, nonlabored ventilation and respiratory function stable Cardiovascular status: blood pressure returned to baseline and stable Postop Assessment: no apparent nausea or vomiting Anesthetic complications: no   No notable events documented.  Last Vitals:  Vitals:   08/28/21 1055 08/28/21 1106  BP: 130/85 115/76  Pulse: (!) 109 99  Resp: 16 18  Temp: 36.7 C 36.6 C  SpO2: 93% 94%    Last Pain:  Vitals:   08/28/21 1106  TempSrc: Oral  PainSc:                  ,W. EDMOND

## 2021-08-28 NOTE — Brief Op Note (Signed)
08/28/2021  9:54 AM  PATIENT:  Anna Wilkins  65 y.o. female  PRE-OPERATIVE DIAGNOSIS:  Right shoulder end stage osteoarthritis  POST-OPERATIVE DIAGNOSIS:  Right shoulder end stage osteoarthritis, rotator cuff insufficiency  PROCEDURE:  Procedure(s) with comments: REVERSE TOTAL SHOULDER ARTHROPLASTY (Right) - with ISB DePuy Delta Xtend with Subscap repair  SURGEON:  Surgeon(s) and Role:    Beverely Low, MD - Primary  PHYSICIAN ASSISTANT:   ASSISTANTS: Thea Gist, PA-C   ANESTHESIA:   regional and general  EBL:  50 mL   BLOOD ADMINISTERED:none  DRAINS: none   LOCAL MEDICATIONS USED:  MARCAINE     SPECIMEN:  No Specimen  DISPOSITION OF SPECIMEN:  N/A  COUNTS:  YES  TOURNIQUET:  * No tourniquets in log *  DICTATION: .Other Dictation: Dictation Number [id number]  PLAN OF CARE: Admit for overnight observation  PATIENT DISPOSITION:  PACU - hemodynamically stable.   Delay start of Pharmacological VTE agent (>24hrs) due to surgical blood loss or risk of bleeding: not applicable

## 2021-08-28 NOTE — Interval H&P Note (Signed)
History and Physical Interval Note:  08/28/2021 7:21 AM  Anna Wilkins  has presented today for surgery, with the diagnosis of Right shoulder end stage osteoarthritis.  The various methods of treatment have been discussed with the patient and family. After consideration of risks, benefits and other options for treatment, the patient has consented to  Procedure(s) with comments: TOTAL SHOULDER ARTHROPLASTY (Right) - with ISB as a surgical intervention.  The patient's history has been reviewed, patient examined, no change in status, stable for surgery.  I have reviewed the patient's chart and labs.  Questions were answered to the patient's satisfaction.     [name]

## 2021-08-29 DIAGNOSIS — M19011 Primary osteoarthritis, right shoulder: Secondary | ICD-10-CM | POA: Diagnosis not present

## 2021-08-29 LAB — BASIC METABOLIC PANEL
Anion gap: 8 (ref 5–15)
BUN: 13 mg/dL (ref 8–23)
CO2: 27 mmol/L (ref 22–32)
Calcium: 9.1 mg/dL (ref 8.9–10.3)
Chloride: 105 mmol/L (ref 98–111)
Creatinine, Ser: 0.67 mg/dL (ref 0.44–1.00)
GFR, Estimated: >60 mL/min (ref >60)
Glucose, Bld: 136 mg/dL — ABNORMAL HIGH (ref 70–99)
Potassium: 4.5 mmol/L (ref 3.5–5.1)
Sodium: 140 mmol/L (ref 135–145)

## 2021-08-29 LAB — HEMOGLOBIN AND HEMATOCRIT, BLOOD
HCT: 37.7 % (ref 36.0–46.0)
Hemoglobin: 12.5 g/dL (ref 12.0–15.0)

## 2021-08-29 NOTE — Plan of Care (Signed)
Pt doing well. Pt given D/C instructions with verbal understanding. Rx's were sent to the pharmacy by MD. Pt's incision is clean and dry with no sign of infection. Pt's IV was removed prior to D/C. Pt D/C'd home via wheelchair per MD order. Pt is stable @ D/C and has no other needs at this time.  , RN  

## 2021-08-29 NOTE — Progress Notes (Signed)
Orthopedics Progress Note  Subjective: No complaints this AM. Pain controlled  Objective:  Vitals:   08/29/21 0337 08/29/21 0743  BP: 112/65 (!) 142/74  Pulse: 76 91  Resp: 18 18  Temp: 97.8 F (36.6 C) (!) 97.4 F (36.3 C)  SpO2: 95% 93%    General: Awake and alert  Musculoskeletal: Right shoulder dressing changed. No signs for infection Neurovascularly intact  Lab Results  Component Value Date   WBC 9.3 08/25/2021   HGB 12.5 08/29/2021   HCT 37.7 08/29/2021   MCV 98.1 08/25/2021   PLT 217 08/25/2021       Component Value Date/Time   NA 140 08/29/2021 0505   K 4.5 08/29/2021 0505   CL 105 08/29/2021 0505   CO2 27 08/29/2021 0505   GLUCOSE 136 (H) 08/29/2021 0505   BUN 13 08/29/2021 0505   CREATININE 0.67 08/29/2021 0505   CALCIUM 9.1 08/29/2021 0505   GFRNONAA >60 08/29/2021 0505    No results found for: INR, PROTIME  Assessment/Plan: POD #1 s/p Procedure(s): REVERSE TOTAL SHOULDER ARTHROPLASTY Stable overnight. Home today after therapy.  Follow up in two weeks in the office  Viviann Spare R. Ranell Patrick, MD 08/29/2021 8:02 AM

## 2021-08-29 NOTE — Evaluation (Signed)
Occupational Therapy Evaluation Patient Details Name: Anna Wilkins MRN: [id number] DOB: 07/12/1956 Today's Date: 08/29/2021   History of Present Illness 65 y.o. female presenting with end-stage osteoarthritis s/p elective R reverse shoulder replacement. PMHx significant for previous shoulder surgery x2, TKA x2, HTN, asthma and arthritis.   Clinical Impression   Patient s/p shoulder replacement without functional use of right dominant upper extremity secondary to effects of surgery and interscalene block and shoulder precautions. Therapist provided education and instruction to patient in regards to hand, wrist and elbow exercises, shoulder precautions, positioning at rest and with activity, donning upper extremity clothing and bathing while maintaining shoulder precautions. Education also provided on cryotherapy, edema management and donning/doffing sling. Patient verbalized understanding with return demo and Min cues for adherence. Patient able to donn shirt, underwear, pants, socks and shoes with supervision A to Mod I with instruction on compensatory technique. Patient to follow up with MD for further therapy needs.     Recommendations for follow up therapy are one component of a multi-disciplinary discharge planning process, led by the attending physician.  Recommendations may be updated based on patient status, additional functional criteria and insurance authorization.   Follow Up Recommendations  Supervision - Intermittent;Follow surgeon's recommendation for DC plan and follow-up therapies    Equipment Recommendations  None recommended by OT    Recommendations for Other Services       Precautions / Restrictions Precautions Precautions: Shoulder Type of Shoulder Precautions: No PROM or AROM at shoulder; AROM OK at elbow, wrist and digits Shoulder Interventions: Shoulder sling/immobilizer;Off for dressing/bathing/exercises;At all times Precaution Booklet Issued: Yes  (comment) Precaution Comments: Written and verbal education proivded. Min cues for adherence to shoulder precautions with ADLs. Required Braces or Orthoses: Sling Restrictions Weight Bearing Restrictions: Yes RUE Weight Bearing: Non weight bearing      Mobility Bed Mobility Overal bed mobility: Modified Independent                  Transfers Overall transfer level: Independent                    Balance Overall balance assessment: No apparent balance deficits (not formally assessed)                                         ADL either performed or assessed with clinical judgement   ADL                                               Vision Baseline Vision/History: 1 Wears glasses (Readers) Ability to See in Adequate Light: 0 Adequate Patient Visual Report: No change from baseline Vision Assessment?: No apparent visual deficits     Perception     Praxis      Pertinent Vitals/Pain Pain Assessment: No/denies pain     Hand Dominance Right   Extremity/Trunk Assessment Upper Extremity Assessment Upper Extremity Assessment: RUE deficits/detail;LUE deficits/detail RUE Deficits / Details: AROM WFL at elbow, wrist and hand. Endorses continued numbness from nerve block. RUE: Unable to fully assess due to immobilization (No AROM/PROM at shoulder) LUE Deficits / Details: [facility]   Lower Extremity Assessment Lower Extremity Assessment: Overall WFL for tasks assessed   Cervical / Trunk Assessment Cervical / Trunk Assessment: Normal   Communication  Communication Communication: No difficulties   Cognition Arousal/Alertness: Awake/alert Behavior During Therapy: WFL for tasks assessed/performed Overall Cognitive Status: Within Functional Limits for tasks assessed                                     General Comments       Exercises Exercises: Shoulder   Shoulder Instructions Shoulder  Instructions Donning/doffing shirt without moving shoulder: Supervision/safety Method for sponge bathing under operated UE: Supervision/safety Donning/doffing sling/immobilizer: Supervision/safety Correct positioning of sling/immobilizer: Supervision/safety ROM for elbow, wrist and digits of operated UE: Independent Sling wearing schedule (on at all times/off for ADL's): Independent Proper positioning of operated UE when showering: Independent Positioning of UE while sleeping: Independent    Home Living Family/patient expects to be discharged to:: Private residence Living Arrangements: Spouse/significant other Available Help at Discharge: Family;Available PRN/intermittently Type of Home: House Home Access: Stairs to enter [employer] of Steps: 3 Entrance Stairs-Rails: Right;Left Home Layout: Two level;Bed/bath upstairs;Able to live on main level with bedroom/bathroom Alternate Level Stairs-Number of Steps: 6 Alternate Level Stairs-Rails:  (Unknown laterality) Bathroom Shower/Tub: Tub/shower unit   Bathroom Toilet: Standard     Home Equipment: Environmental consultant - 2 wheels;Walker - 4 wheels;Cane - quad;Cane - single point;Bedside commode;Wheelchair - manual          Prior Functioning/Environment Level of Independence: Independent        Comments: I with ADLs/IADLs; drives        OT Problem List:        OT Treatment/Interventions:      OT Goals(Current goals can be found in the care plan section) Acute Rehab OT Goals Patient Stated Goal: To return home OT Goal Formulation: With patient  OT Frequency:     Barriers to D/C:            Co-evaluation              AM-PAC OT "6 Clicks" Daily Activity     Outcome Measure Help from another person eating meals?: None Help from another person taking care of personal grooming?: None Help from another person toileting, which includes using toliet, bedpan, or urinal?: None Help from another person bathing (including  washing, rinsing, drying)?: A Little Help from another person to put on and taking off regular upper body clothing?: None Help from another person to put on and taking off regular lower body clothing?: None 6 Click Score: 23   End of Session Nurse Communication: Mobility status;Precautions  Activity Tolerance: Patient tolerated treatment well Patient left: in chair;with call bell/phone within reach  OT Visit Diagnosis: Pain Pain - Right/Left: Right Pain - part of body: Shoulder                Time: 1749-4496 OT Time Calculation (min): 20 min Charges:  OT General Charges $OT Visit: 1 Visit OT Evaluation $OT Eval Low Complexity: 1 Low   H. OTR/L Supplemental OT, Department of rehab services [phone]   R H. 08/29/2021, 8:02 AM

## 2021-08-29 NOTE — Discharge Summary (Signed)
In most cases prophylactic antibiotics for Dental procdeures after total joint surgery are not necessary.  Exceptions are as follows:  1. History of prior total joint infection  2. Severely immunocompromised (Organ Transplant, cancer chemotherapy, Rheumatoid biologic meds such as Humera)  3. Poorly controlled diabetes (A1C &gt; 8.0, blood glucose over 200)  If you have one of these conditions, contact your surgeon for an antibiotic prescription, prior to your dental procedure. Orthopedic Discharge Summary        Physician Discharge Summary  Patient ID: Anna Wilkins MRN: [id number] DOB/AGE: [birth year] 65 y.o.  Admit date: 08/28/2021 Discharge date: 08/29/2021   Procedures:  Procedure(s) (LRB): REVERSE TOTAL SHOULDER ARTHROPLASTY (Right)  Attending Physician:  Dr. Malon Kindle  Admission Diagnoses:   right shoulder OA end stage  Discharge Diagnoses:  same   Past Medical History:  Diagnosis Date   Arthritis    Asthma    Complication of anesthesia    Dyspnea    Hypertension    PONV (postoperative nausea and vomiting)     PCP: Ardelle Balls, MD   Discharged Condition: good  Hospital Course:  Patient underwent the above stated procedure on 08/28/2021. Patient tolerated the procedure well and brought to the recovery room in good condition and subsequently to the floor. Patient had an uncomplicated hospital course and was stable for discharge.   Disposition: Discharge disposition: 01-Home or Self Care      with follow up in 2 weeks    Follow-up Information     Beverely Low, MD. Call in 2 week(s).   Specialty: Orthopedic Surgery Why: call [phone] for appt Contact information: [address] [place] [address] [place] [address] [phone]                 Dental Antibiotics:  In most cases prophylactic antibiotics for Dental procdeures after total joint surgery are not necessary.  Exceptions are as follows:  1. History of  prior total joint infection  2. Severely immunocompromised (Organ Transplant, cancer chemotherapy, Rheumatoid biologic meds such as Humera)  3. Poorly controlled diabetes (A1C &gt; 8.0, blood glucose over 200)  If you have one of these conditions, contact your surgeon for an antibiotic prescription, prior to your dental procedure.  Discharge Instructions     Call MD / Call 911   Complete by: As directed    If you experience chest pain or shortness of breath, CALL 911 and be transported to the hospital emergency room.  If you develope a fever above 101 F, pus (white drainage) or increased drainage or redness at the wound, or calf pain, call your surgeon's office.   Constipation Prevention   Complete by: As directed    Drink plenty of fluids.  Prune juice may be helpful.  You may use a stool softener, such as Colace (over the counter) 100 mg twice a day.  Use MiraLax (over the counter) for constipation as needed.   Diet - low sodium heart healthy   Complete by: As directed    Increase activity slowly as tolerated   Complete by: As directed    Post-operative opioid taper instructions:   Complete by: As directed    POST-OPERATIVE OPIOID TAPER INSTRUCTIONS: It is important to wean off of your opioid medication as soon as possible. If you do not need pain medication after your surgery it is ok to stop day one. Opioids include: Codeine, Hydrocodone(Norco, Vicodin), Oxycodone(Percocet, oxycontin) and hydromorphone amongst others.  Long term and even short term use  of opiods can cause: Increased pain response Dependence Constipation Depression Respiratory depression And more.  Withdrawal symptoms can include Flu like symptoms Nausea, vomiting And more Techniques to manage these symptoms Hydrate well Eat regular healthy meals Stay active Use relaxation techniques(deep breathing, meditating, yoga) Do Not substitute Alcohol to help with tapering If you have been on opioids for less  than two weeks and do not have pain than it is ok to stop all together.  Plan to wean off of opioids This plan should start within one week post op of your joint replacement. Maintain the same interval or time between taking each dose and first decrease the dose.  Cut the total daily intake of opioids by one tablet each day Next start to increase the time between doses. The last dose that should be eliminated is the evening dose.          Allergies as of 08/29/2021       Reactions   Codeine Nausea And Vomiting   Penicillins Nausea And Vomiting   Sulfa Antibiotics Rash        Medication List     TAKE these medications    ENBREL [place] Inject into the skin.   folic acid 1 MG tablet Commonly known as: FOLVITE Take 1 mg by mouth daily.   HYDROcodone-acetaminophen 5-325 MG tablet Commonly known as: Norco Take 1 tablet by mouth every 6 (six) hours as needed for moderate pain.   ibuprofen 800 MG tablet Commonly known as: ADVIL Take 800 mg by mouth 2 (two) times daily.   methocarbamol 500 MG tablet Commonly known as: Robaxin Take 1 tablet (500 mg total) by mouth every 8 (eight) hours as needed for muscle spasms.   methotrexate 2.5 MG tablet Commonly known as: RHEUMATREX Take 22.5 mg by mouth once a week.   ondansetron 4 MG tablet Commonly known as: Zofran Take 1 tablet (4 mg total) by mouth every 8 (eight) hours as needed for nausea, vomiting or refractory nausea / vomiting.   traMADol 50 MG tablet Commonly known as: ULTRAM Take 50 mg by mouth 2 (two) times daily.          Signed: [name] 08/29/2021, 8:03 AM  [facility] is now [employer] [address], [place], [place] [address] Phone: [phone] Facebook  Instagram  [employer]

## 2021-08-30 ENCOUNTER — Encounter (HOSPITAL_COMMUNITY): Payer: Self-pay | Admitting: Orthopedic Surgery

## 2022-10-11 IMAGING — DX DG SHOULDER 2+V PORT*R*
1 series · 1 of 1 positions shown · non-contrast
Comparison: No available priors.

CLINICAL DATA: Postop right shoulder replacement.

EXAM:
PORTABLE RIGHT SHOULDER

[shoulder]
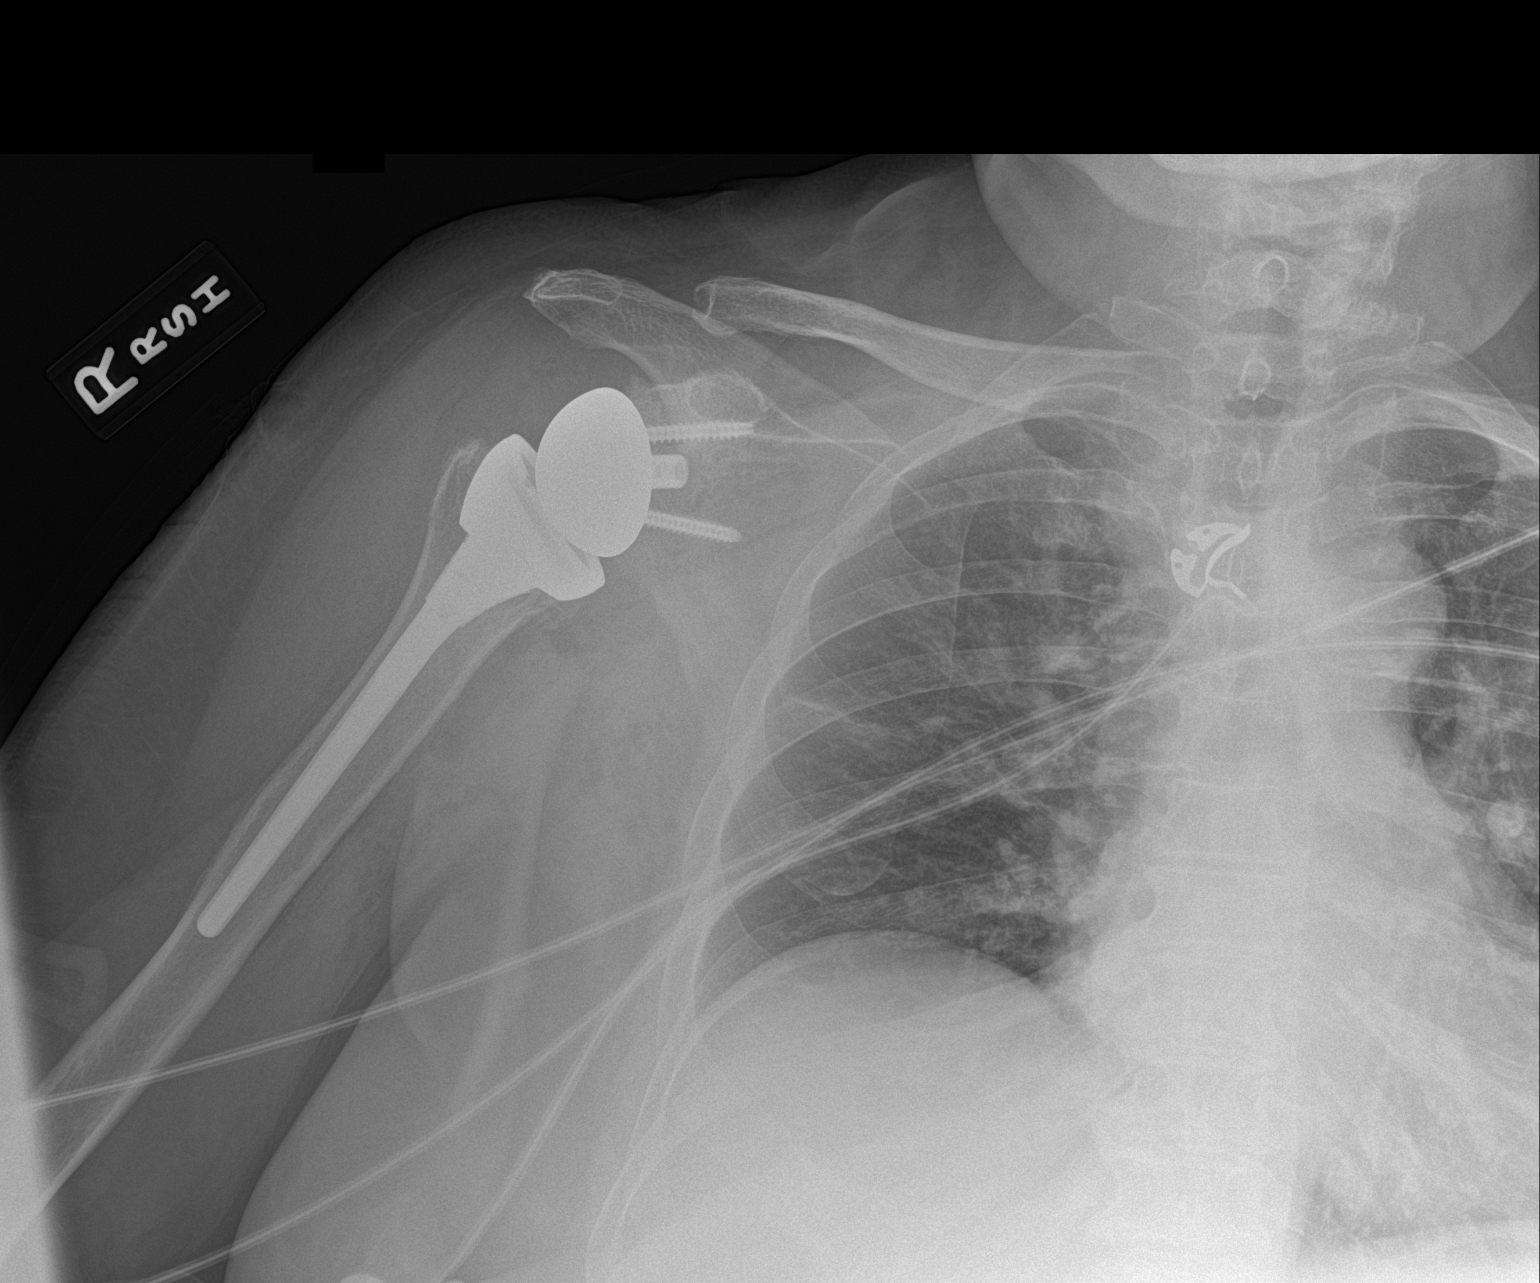

[1 of 1 positions shown; findings below may reference images not displayed]

FINDINGS: Right shoulder arthroplasty. Humeral stem appears well seated.
Subcutaneous and joint air. Distal right clavicle resection appears
remote. There may be mild vascular prominence in the right lung with
right perihilar subsegmental atelectasis.
IMPRESSION: Right shoulder arthroplasty with expected postoperative findings.

## 2023-06-05 NOTE — H&P (Signed)
Patient's anticipated LOS is less than 2 midnights, meeting these requirements: - Younger than 40 - Lives within 1 hour of care - Has a competent adult at home to recover with post-op recover - NO history of  - Chronic pain requiring opiods  - Diabetes  - Coronary Artery Disease  - Heart failure  - Heart attack  - Stroke  - DVT/VTE  - Cardiac arrhythmia  - Respiratory Failure/COPD  - Renal failure  - Anemia  - Advanced Liver disease     Anna Wilkins is an 67 y.o. female.    Chief Complaint: left shoulder pain  HPI: Pt is a 67 y.o. female complaining of left shoulder pain for multiple years. Pain had continually increased since the beginning. X-rays in the clinic show end-stage arthritic changes of the left shoulder. Pt has tried various conservative treatments which have failed to alleviate their symptoms, including injections and therapy. Various options are discussed with the patient. Risks, benefits and expectations were discussed with the patient. Patient understand the risks, benefits and expectations and wishes to proceed with surgery.   PCP:  Ardelle Balls, MD  D/C Plans: Home  PMH: Past Medical History:  Diagnosis Date   Arthritis    Asthma    Complication of anesthesia    Dyspnea    Hypertension    PONV (postoperative nausea and vomiting)     PSH: Past Surgical History:  Procedure Laterality Date   ABDOMINAL HYSTERECTOMY     BACK SURGERY     CATARACT EXTRACTION, BILATERAL     EYE SURGERY     JOINT REPLACEMENT     ROTATOR CUFF REPAIR     TOTAL SHOULDER ARTHROPLASTY Right 08/28/2021   Procedure: REVERSE TOTAL SHOULDER ARTHROPLASTY;  Surgeon: Beverely Low, MD;  Location: [facility] OR;  Service: Orthopedics;  Laterality: Right;  with ISB    Social History:  reports that she has never smoked. She has never used smokeless tobacco. She reports that she does not drink alcohol and does not use drugs. BMI: Estimated body mass index is 37.41 kg/m as calculated from  the following:   Height as of 08/28/21: 5\' 1"  (1.549 m).   Weight as of 08/28/21: 89.8 kg.  No results found for: "ALBUMIN" Diabetes: Patient does not have a diagnosis of diabetes.     Smoking Status:   reports that she has never smoked. She has never used smokeless tobacco.    Allergies:  Allergies  Allergen Reactions   Codeine Nausea And Vomiting   Penicillins Nausea And Vomiting   Sulfa Antibiotics Rash    Medications: No current facility-administered medications for this encounter.   Current Outpatient Medications  Medication Sig Dispense Refill   Etanercept (ENBREL [place]) Inject into the skin.     folic acid (FOLVITE) 1 MG tablet Take 1 mg by mouth daily.     HYDROcodone-acetaminophen (NORCO) 5-325 MG tablet Take 1 tablet by mouth every 6 (six) hours as needed for moderate pain. 30 tablet 0   ibuprofen (ADVIL) 800 MG tablet Take 800 mg by mouth 2 (two) times daily.     methocarbamol (ROBAXIN) 500 MG tablet Take 1 tablet (500 mg total) by mouth every 8 (eight) hours as needed for muscle spasms. 40 tablet 1   methotrexate (RHEUMATREX) 2.5 MG tablet Take 22.5 mg by mouth once a week.     ondansetron (ZOFRAN) 4 MG tablet Take 1 tablet (4 mg total) by mouth every 8 (eight) hours as needed for nausea, vomiting or refractory  nausea / vomiting. 30 tablet 0   traMADol (ULTRAM) 50 MG tablet Take 50 mg by mouth 2 (two) times daily.      No results found for this or any previous visit (from the past 48 hour(s)). No results found.  ROS: Pain with rom of the left upper extremity  Physical Exam: Alert and oriented 67 y.o. female in no acute distress Cranial nerves 2-12 intact Cervical spine: full rom with no tenderness, nv intact distally Chest: active breath sounds bilaterally, no wheeze rhonchi or rales Heart: regular rate and rhythm, no murmur Abd: non tender non distended with active bowel sounds Hip is stable with rom  Left shoulder painful and weak rom Nv intact  distally No rashes or edema distally  Assessment/Plan Assessment: left shoulder cuff arthropathy  Plan:  Patient will undergo a left reverse total shoulder by Dr. Ranell Patrick at [facility] Risks benefits and expectations were discussed with the patient. Patient understand risks, benefits and expectations and wishes to proceed. Preoperative templating of the joint replacement has been completed, documented, and submitted to the Operating Room personnel in order to optimize intra-operative equipment management.   Alphonsa Overall PA-C, MPAS [facility] is now [employer] [address], [place], [place] [address] Phone: [phone] [email or web address] Facebook  [employer]

## 2023-06-13 NOTE — Progress Notes (Addendum)
COVID Vaccine Completed:  Yes  Date of COVID positive in last 90 days:  No  PCP - Liliana Cline, MD Cardiologist - N/A  Chest x-ray - CT chest 12-19-22 [facility] EKG - 06-14-23 Epic Stress Test - 20+ years ago ECHO -  N/A Cardiac Cath -  N/A Pacemaker/ICD device last checked: Spinal Cord Stimulator:  N/A  Bowel Prep - N/A  Sleep Study -  N/A CPAP -   Fasting Blood Sugar -  N/A Checks Blood Sugar _____ times a day  Last dose of GLP1 agonist-  N/A GLP1 instructions:  N/A   Last dose of SGLT-2 inhibitors-  N/A SGLT-2 instructions: N/A  Blood Thinner Instructions:   N/A Aspirin Instructions: Last Dose:  Activity level:  Can go up a flight of stairs and perform activities of daily living without stopping and without symptoms of chest pain or shortness of breath.  Anesthesia review:  N/A  Patient denies shortness of breath, fever, cough and chest pain at PAT appointment  Patient verbalized understanding of instructions that were given to them at the PAT appointment. Patient was also instructed that they will need to review over the PAT instructions again at home before surgery.

## 2023-06-13 NOTE — Patient Instructions (Addendum)
SURGICAL WAITING ROOM VISITATION Patients having surgery or a procedure may have no more than 2 support people in the waiting area - these visitors may rotate.    Children under the age of 70 must have an adult with them who is not the patient.  If the patient needs to stay at the hospital during part of their recovery, the visitor guidelines for inpatient rooms apply. Pre-op nurse will coordinate an appropriate time for 1 support person to accompany patient in pre-op.  This support person may not rotate.    Please refer to the [facility] website for the visitor guidelines for Inpatients (after your surgery is over and you are in a regular room).       Your procedure is scheduled on: 06-21-23   Report to [facility] Main Entrance    Report to admitting at 5:15 AM   Call this number if you have problems the morning of surgery [phone]   Do not eat food :After Midnight.   After Midnight you may have the following liquids until 4:20 AM DAY OF SURGERY  Water Non-Citrus Juices (without pulp, NO RED-Apple, White grape, White cranberry) Black Coffee (NO MILK/CREAM OR CREAMERS, sugar ok)  Clear Tea (NO MILK/CREAM OR CREAMERS, sugar ok) regular and decaf                             Plain Jell-O (NO RED)                                           Fruit ices (not with fruit pulp, NO RED)                                     Popsicles (NO RED)                                                               Sports drinks like Gatorade (NO RED)                   The day of surgery:  Drink ONE (1) Pre-Surgery Clear Ensure at 4:20 AM the morning of surgery. Drink in one sitting. Do not sip.  This drink was given to you during your hospital  pre-op appointment visit. Nothing else to drink after completing the Pre-Surgery Clear Ensure          If you have questions, please contact your surgeon's office.   FOLLOW ANY ADDITIONAL PRE OP INSTRUCTIONS YOU RECEIVED FROM YOUR SURGEON'S  OFFICE!!!     Oral Hygiene is also important to reduce your risk of infection.                                    Remember - BRUSH YOUR TEETH THE MORNING OF SURGERY WITH YOUR REGULAR TOOTHPASTE   Do NOT smoke after Midnight   Take these medicines the morning of surgery with A SIP OF WATER:  Tramadol if needed  Stop all vitamins  and herbal supplements 7 days before surgery                              You may not have any metal on your body including hair pins, jewelry, and body piercing             Do not wear make-up, lotions, powders, perfumes or deodorant  Do not wear nail polish including gel and S&S, artificial/acrylic nails, or any other type of covering on natural nails including finger and toenails. If you have artificial nails, gel coating, etc. that needs to be removed by a nail salon please have this removed prior to surgery or surgery may need to be canceled/ delayed if the surgeon/ anesthesia feels like they are unable to be safely monitored.   Do not shave  48 hours prior to surgery.          Do not bring valuables to the hospital. [facility] IS NOT RESPONSIBLE   FOR VALUABLES.   Contacts, dentures or bridgework may not be worn into surgery.   Bring small overnight bag day of surgery.   DO NOT BRING YOUR HOME MEDICATIONS TO THE HOSPITAL. PHARMACY WILL DISPENSE MEDICATIONS LISTED ON YOUR MEDICATION LIST TO YOU DURING YOUR ADMISSION IN THE HOSPITAL!               Please read over the following fact sheets you were given: IF YOU HAVE QUESTIONS ABOUT YOUR PRE-OP INSTRUCTIONS PLEASE CALL [phone] G[facility]  If you received a COVID test during your pre-op visit  it is requested that you wear a mask when out in public, stay away from anyone that may not be feeling well and notify your surgeon if you develop symptoms. If you test positive for Covid or have been in contact with anyone that has tested positive in the last 10 days please notify you surgeon.  [place]-  Preparing for Total Shoulder Arthroplasty    Before surgery, you can play an important role. Because skin is not sterile, your skin needs to be as free of germs as possible. You can reduce the number of germs on your skin by using the following products. Benzoyl Peroxide Gel Reduces the number of germs present on the skin Applied twice a day to shoulder area starting two days before surgery    ==================================================================  Please follow these instructions carefully:  BENZOYL PEROXIDE 5% GEL  Please do not use if you have an allergy to benzoyl peroxide.   If your skin becomes reddened/irritated stop using the benzoyl peroxide.  Starting two days before surgery, apply as follows: Apply benzoyl peroxide in the morning and at night. Apply after taking a shower. If you are not taking a shower clean entire shoulder front, back, and side along with the armpit with a clean wet washcloth.  Place a quarter-sized dollop on your shoulder and rub in thoroughly, making sure to cover the front, back, and side of your shoulder, along with the armpit.   2 days before ____ AM   ____ PM              1 day before ____ AM   ____ PM                         Do this twice a day for two days.  (Last application is the night before surgery, AFTER using the CHG soap as  described below).  Do NOT apply benzoyl peroxide gel on the day of surgery.     Pre-operative 5 CHG Bath Instructions   You can play a key role in reducing the risk of infection after surgery. Your skin needs to be as free of germs as possible. You can reduce the number of germs on your skin by washing with CHG (chlorhexidine gluconate) soap before surgery. CHG is an antiseptic soap that kills germs and continues to kill germs even after washing.   DO NOT use if you have an allergy to chlorhexidine/CHG or antibacterial soaps. If your skin becomes reddened or irritated, stop using the CHG and notify one of  our RNs at  [phone] .   Please shower with the CHG soap starting 4 days before surgery using the following schedule:     Please keep in mind the following:  DO NOT shave, including legs and underarms, starting the day of your first shower.   You may shave your face at any point before/day of surgery.  Place clean sheets on your bed the day you start using CHG soap. Use a clean washcloth (not used since being washed) for each shower. DO NOT sleep with pets once you start using the CHG.   CHG Shower Instructions:  If you choose to wash your hair and private area, wash first with your normal shampoo/soap.  After you use shampoo/soap, rinse your hair and body thoroughly to remove shampoo/soap residue.  Turn the water OFF and apply about 3 tablespoons (45 ml) of CHG soap to a CLEAN washcloth.  Apply CHG soap ONLY FROM YOUR NECK DOWN TO YOUR TOES (washing for 3-5 minutes)  DO NOT use CHG soap on face, private areas, open wounds, or sores.  Pay special attention to the area where your surgery is being performed.  If you are having back surgery, having someone wash your back for you may be helpful. Wait 2 minutes after CHG soap is applied, then you may rinse off the CHG soap.  Pat dry with a clean towel  Put on clean clothes/pajamas   If you choose to wear lotion, please use ONLY the CHG-compatible lotions on the back of this paper.     Additional instructions for the day of surgery: DO NOT APPLY any lotions, deodorants, cologne, or perfumes.   Put on clean/comfortable clothes.  Brush your teeth.  Ask your nurse before applying any prescription medications to the skin.      CHG Compatible Lotions   Aveeno Moisturizing lotion  Cetaphil Moisturizing Cream  Cetaphil Moisturizing Lotion  Clairol Herbal Essence Moisturizing Lotion, Dry Skin  Clairol Herbal Essence Moisturizing Lotion, Extra Dry Skin  Clairol Herbal Essence Moisturizing Lotion, Normal Skin  Curel Age Defying  Therapeutic Moisturizing Lotion with Alpha Hydroxy  Curel Extreme Care Body Lotion  Curel Soothing Hands Moisturizing Hand Lotion  Curel Therapeutic Moisturizing Cream, Fragrance-Free  Curel Therapeutic Moisturizing Lotion, Fragrance-Free  Curel Therapeutic Moisturizing Lotion, Original Formula  Eucerin Daily Replenishing Lotion  Eucerin Dry Skin Therapy Plus Alpha Hydroxy Crme  Eucerin Dry Skin Therapy Plus Alpha Hydroxy Lotion  Eucerin Original Crme  Eucerin Original Lotion  Eucerin Plus Crme Eucerin Plus Lotion  Eucerin TriLipid Replenishing Lotion  Keri Anti-Bacterial Hand Lotion  Keri Deep Conditioning Original Lotion Dry Skin Formula Softly Scented  Keri Deep Conditioning Original Lotion, Fragrance Free Sensitive Skin Formula  Keri Lotion Fast Absorbing Fragrance Free Sensitive Skin Formula  Keri Lotion Fast Absorbing Softly Scented Dry Skin Formula  Keri Original Lotion  [employer] Skin Renewal Lotion Keri Silky Smooth Lotion  Keri Silky Smooth Sensitive Skin Lotion  Nivea Body Creamy Conditioning Oil  Nivea Body Extra Enriched Lotion  Nivea Body Original Lotion  Nivea Body Sheer Moisturizing Lotion Nivea Crme  Nivea Skin Firming Lotion  NutraDerm 30 Skin Lotion  NutraDerm Skin Lotion  NutraDerm Therapeutic Skin Cream  NutraDerm Therapeutic Skin Lotion  ProShield Protective Hand Cream  Provon moisturizing lotion   PATIENT SIGNATURE_________________________________  NURSE SIGNATURE__________________________________  ________________________________________________________________________    Rogelia Mire  An incentive spirometer is a tool that can help keep your lungs clear and active. This tool measures how well you are filling your lungs with each breath. Taking long deep breaths may help reverse or decrease the chance of developing breathing (pulmonary) problems (especially infection) following: A long period of time when you are unable to move or be  active. BEFORE THE PROCEDURE  If the spirometer includes an indicator to show your best effort, your nurse or respiratory therapist will set it to a desired goal. If possible, sit up straight or lean slightly forward. Try not to slouch. Hold the incentive spirometer in an upright position. INSTRUCTIONS FOR USE  Sit on the edge of your bed if possible, or sit up as far as you can in bed or on a chair. Hold the incentive spirometer in an upright position. Breathe out normally. Place the mouthpiece in your mouth and seal your lips tightly around it. Breathe in slowly and as deeply as possible, raising the piston or the ball toward the top of the column. Hold your breath for 3-5 seconds or for as long as possible. Allow the piston or ball to fall to the bottom of the column. Remove the mouthpiece from your mouth and breathe out normally. Rest for a few seconds and repeat Steps 1 through 7 at least 10 times every 1-2 hours when you are awake. Take your time and take a few normal breaths between deep breaths. The spirometer may include an indicator to show your best effort. Use the indicator as a goal to work toward during each repetition. After each set of 10 deep breaths, practice coughing to be sure your lungs are clear. If you have an incision (the cut made at the time of surgery), support your incision when coughing by placing a pillow or rolled up towels firmly against it. Once you are able to get out of bed, walk around indoors and cough well. You may stop using the incentive spirometer when instructed by your caregiver.  RISKS AND COMPLICATIONS Take your time so you do not get dizzy or light-headed. If you are in pain, you may need to take or ask for pain medication before doing incentive spirometry. It is harder to take a deep breath if you are having pain. AFTER USE Rest and breathe slowly and easily. It can be helpful to keep track of a log of your progress. Your caregiver can provide you  with a simple table to help with this. If you are using the spirometer at home, follow these instructions: SEEK MEDICAL CARE IF:  You are having difficultly using the spirometer. You have trouble using the spirometer as often as instructed. Your pain medication is not giving enough relief while using the spirometer. You develop fever of 100.5 F (38.1 C) or higher. SEEK IMMEDIATE MEDICAL CARE IF:  You cough up bloody sputum that had not been present before. You develop fever of 102 F (38.9 C) or  greater. You develop worsening pain at or near the incision site. MAKE SURE YOU:  Understand these instructions. Will watch your condition. Will get help right away if you are not doing well or get worse. Document Released: 03/11/2007 Document Revised: 01/21/2012 Document Reviewed: 05/12/2007 [facility] Patient Information 2014 [place], [place].   ________________________________________________________________________

## 2023-06-14 ENCOUNTER — Other Ambulatory Visit: Payer: Self-pay

## 2023-06-14 ENCOUNTER — Encounter (HOSPITAL_COMMUNITY)
Admission: RE | Admit: 2023-06-14 | Discharge: 2023-06-14 | Disposition: A | Discharge status: Home / Self Care | Payer: No Typology Code available for payment source | Source: Ambulatory Visit | Attending: Orthopedic Surgery | Admitting: Orthopedic Surgery

## 2023-06-14 ENCOUNTER — Encounter (HOSPITAL_COMMUNITY): Payer: Self-pay

## 2023-06-14 VITALS — BP 125/78 | HR 81 | Temp 98.1°F | Resp 20 | Ht 61.0 in | Wt 200.0 lb

## 2023-06-14 DIAGNOSIS — Z01818 Encounter for other preprocedural examination: Secondary | ICD-10-CM | POA: Diagnosis present

## 2023-06-14 DIAGNOSIS — I10 Essential (primary) hypertension: Secondary | ICD-10-CM | POA: Diagnosis not present

## 2023-06-14 HISTORY — DX: Anemia, unspecified: D64.9

## 2023-06-14 LAB — BASIC METABOLIC PANEL
Anion gap: 10 (ref 5–15)
BUN: 22 mg/dL (ref 8–23)
CO2: 26 mmol/L (ref 22–32)
Calcium: 8.9 mg/dL (ref 8.9–10.3)
Chloride: 99 mmol/L (ref 98–111)
Creatinine, Ser: 0.74 mg/dL (ref 0.44–1.00)
GFR, Estimated: >60 mL/min (ref >60)
Glucose, Bld: 98 mg/dL (ref 70–99)
Potassium: 3.4 mmol/L — ABNORMAL LOW (ref 3.5–5.1)
Sodium: 135 mmol/L (ref 135–145)

## 2023-06-14 LAB — CBC
HCT: 38.9 % (ref 36.0–46.0)
Hemoglobin: 13 g/dL (ref 12.0–15.0)
MCH: 33.2 pg (ref 26.0–34.0)
MCHC: 33.4 g/dL (ref 30.0–36.0)
MCV: 99.5 fL (ref 80.0–100.0)
Platelets: 197 10*3/uL (ref 150–400)
RBC: 3.91 MIL/uL (ref 3.87–5.11)
RDW: 13.5 % (ref 11.5–15.5)
WBC: 8 10*3/uL (ref 4.0–10.5)
nRBC: 0 % (ref 0.0–0.2)

## 2023-06-14 LAB — SURGICAL PCR SCREEN
MRSA, PCR: NEGATIVE
Staphylococcus aureus: NEGATIVE

## 2023-06-21 ENCOUNTER — Ambulatory Visit (HOSPITAL_BASED_OUTPATIENT_CLINIC_OR_DEPARTMENT_OTHER): Payer: No Typology Code available for payment source | Admitting: Certified Registered"

## 2023-06-21 ENCOUNTER — Observation Stay (HOSPITAL_COMMUNITY)
Admission: RE | Admit: 2023-06-21 | Discharge: 2023-06-22 | Disposition: A | Discharge status: Home / Self Care | Payer: No Typology Code available for payment source | Source: Ambulatory Visit | Attending: Orthopedic Surgery | Admitting: Orthopedic Surgery

## 2023-06-21 ENCOUNTER — Encounter (HOSPITAL_COMMUNITY): Payer: Self-pay | Admitting: Orthopedic Surgery

## 2023-06-21 ENCOUNTER — Other Ambulatory Visit: Payer: Self-pay

## 2023-06-21 ENCOUNTER — Observation Stay (HOSPITAL_COMMUNITY): Payer: Medicare Other

## 2023-06-21 ENCOUNTER — Encounter (HOSPITAL_COMMUNITY)
Admission: RE | Disposition: A | Discharge status: Home / Self Care | Payer: Self-pay | Source: Ambulatory Visit | Attending: Orthopedic Surgery

## 2023-06-21 ENCOUNTER — Ambulatory Visit (HOSPITAL_COMMUNITY): Payer: No Typology Code available for payment source | Admitting: Certified Registered"

## 2023-06-21 DIAGNOSIS — M19012 Primary osteoarthritis, left shoulder: Secondary | ICD-10-CM

## 2023-06-21 DIAGNOSIS — M12812 Other specific arthropathies, not elsewhere classified, left shoulder: Secondary | ICD-10-CM | POA: Insufficient documentation

## 2023-06-21 DIAGNOSIS — J45909 Unspecified asthma, uncomplicated: Secondary | ICD-10-CM | POA: Diagnosis not present

## 2023-06-21 DIAGNOSIS — Z96611 Presence of right artificial shoulder joint: Secondary | ICD-10-CM | POA: Insufficient documentation

## 2023-06-21 DIAGNOSIS — I10 Essential (primary) hypertension: Secondary | ICD-10-CM | POA: Insufficient documentation

## 2023-06-21 DIAGNOSIS — Z79899 Other long term (current) drug therapy: Secondary | ICD-10-CM | POA: Insufficient documentation

## 2023-06-21 DIAGNOSIS — Z96612 Presence of left artificial shoulder joint: Principal | ICD-10-CM

## 2023-06-21 HISTORY — PX: REVERSE SHOULDER ARTHROPLASTY: SHX5054

## 2023-06-21 SURGERY — ARTHROPLASTY, SHOULDER, TOTAL, REVERSE
Anesthesia: Regional | Site: Shoulder | Laterality: Left

## 2023-06-21 MED ORDER — METHOTREXATE 2.5 MG PO TABS
22.5000 mg | ORAL_TABLET | ORAL | Status: DC
  Filled 2023-06-21: qty 9

## 2023-06-21 MED ORDER — STERILE WATER FOR IRRIGATION IR SOLN
Status: DC | PRN
  Administered 2023-06-21: 2000 mL

## 2023-06-21 MED ORDER — ACETAMINOPHEN 325 MG PO TABS
325.0000 mg | ORAL_TABLET | Freq: Four times a day (QID) | ORAL | Status: DC | PRN
  Administered 2023-06-21: 650 mg via ORAL
  Filled 2023-06-21: qty 2

## 2023-06-21 MED ORDER — ESMOLOL HCL 100 MG/10ML IV SOLN
INTRAVENOUS | Status: AC
  Filled 2023-06-21: qty 10

## 2023-06-21 MED ORDER — EPHEDRINE SULFATE-NACL 50-0.9 MG/10ML-% IV SOSY
PREFILLED_SYRINGE | INTRAVENOUS | Status: DC | PRN
  Administered 2023-06-21 (×2): 5 mg via INTRAVENOUS

## 2023-06-21 MED ORDER — CEFAZOLIN SODIUM-DEXTROSE 2-4 GM/100ML-% IV SOLN
2.0000 g | INTRAVENOUS | Status: AC
  Administered 2023-06-21: 2 g via INTRAVENOUS
  Filled 2023-06-21: qty 100

## 2023-06-21 MED ORDER — ONDANSETRON HCL 4 MG/2ML IJ SOLN
INTRAMUSCULAR | Status: AC
  Filled 2023-06-21: qty 2

## 2023-06-21 MED ORDER — MIDAZOLAM HCL 2 MG/2ML IJ SOLN
INTRAMUSCULAR | Status: DC | PRN
  Administered 2023-06-21: 1 mg via INTRAVENOUS

## 2023-06-21 MED ORDER — PHENYLEPHRINE HCL-NACL 20-0.9 MG/250ML-% IV SOLN
INTRAVENOUS | Status: DC | PRN
  Administered 2023-06-21: 20 ug/min via INTRAVENOUS

## 2023-06-21 MED ORDER — FENTANYL CITRATE PF 50 MCG/ML IJ SOSY: 25.0000 ug | PREFILLED_SYRINGE | INTRAMUSCULAR | Status: DC | PRN

## 2023-06-21 MED ORDER — ONDANSETRON HCL 4 MG PO TABS: 4.0000 mg | ORAL_TABLET | Freq: Four times a day (QID) | ORAL | Status: DC | PRN

## 2023-06-21 MED ORDER — ONDANSETRON HCL 4 MG/2ML IJ SOLN: 4.0000 mg | Freq: Four times a day (QID) | INTRAMUSCULAR | Status: DC | PRN

## 2023-06-21 MED ORDER — ETANERCEPT 25 MG/0.5ML ~~LOC~~ SOLN: 50.0000 mg | SUBCUTANEOUS | Status: DC

## 2023-06-21 MED ORDER — ESMOLOL HCL 100 MG/10ML IV SOLN: INTRAVENOUS | Status: DC | PRN

## 2023-06-21 MED ORDER — METHOCARBAMOL 500 MG PO TABS: 500.0000 mg | ORAL_TABLET | Freq: Four times a day (QID) | ORAL | Status: DC | PRN

## 2023-06-21 MED ORDER — PROPOFOL 10 MG/ML IV BOLUS
INTRAVENOUS | Status: DC | PRN
Start: 2023-06-21 — End: 2023-06-21
  Administered 2023-06-21: 150 mg via INTRAVENOUS
  Administered 2023-06-21: 50 mg via INTRAVENOUS

## 2023-06-21 MED ORDER — ALBUTEROL SULFATE HFA 108 (90 BASE) MCG/ACT IN AERS: 2.0000 | INHALATION_SPRAY | Freq: Four times a day (QID) | RESPIRATORY_TRACT | Status: DC | PRN

## 2023-06-21 MED ORDER — TRANEXAMIC ACID-NACL 1000-0.7 MG/100ML-% IV SOLN
1000.0000 mg | INTRAVENOUS | Status: AC
  Administered 2023-06-21: 1000 mg via INTRAVENOUS
  Filled 2023-06-21: qty 100

## 2023-06-21 MED ORDER — FENTANYL CITRATE (PF) 100 MCG/2ML IJ SOLN
INTRAMUSCULAR | Status: AC
  Filled 2023-06-21: qty 2

## 2023-06-21 MED ORDER — 0.9 % SODIUM CHLORIDE (POUR BTL) OPTIME
TOPICAL | Status: DC | PRN
  Administered 2023-06-21: 1000 mL

## 2023-06-21 MED ORDER — ORAL CARE MOUTH RINSE: 15.0000 mL | Freq: Once | OROMUCOSAL | Status: AC

## 2023-06-21 MED ORDER — ONDANSETRON HCL 4 MG/2ML IJ SOLN
INTRAMUSCULAR | Status: DC | PRN
  Administered 2023-06-21: 4 mg via INTRAVENOUS

## 2023-06-21 MED ORDER — METOCLOPRAMIDE HCL 5 MG PO TABS: 5.0000 mg | ORAL_TABLET | Freq: Three times a day (TID) | ORAL | Status: DC | PRN

## 2023-06-21 MED ORDER — ACETAMINOPHEN 10 MG/ML IV SOLN
INTRAVENOUS | Status: AC
  Filled 2023-06-21: qty 100

## 2023-06-21 MED ORDER — LISINOPRIL 20 MG PO TABS
20.0000 mg | ORAL_TABLET | Freq: Every day | ORAL | Status: DC
  Administered 2023-06-22: 20 mg via ORAL
  Filled 2023-06-21: qty 1

## 2023-06-21 MED ORDER — TRANEXAMIC ACID-NACL 1000-0.7 MG/100ML-% IV SOLN
1000.0000 mg | Freq: Once | INTRAVENOUS | Status: AC
  Administered 2023-06-21: 1000 mg via INTRAVENOUS
  Filled 2023-06-21: qty 100

## 2023-06-21 MED ORDER — HYDROMORPHONE HCL 1 MG/ML IJ SOLN: 0.5000 mg | INTRAMUSCULAR | Status: DC | PRN

## 2023-06-21 MED ORDER — POLYVINYL ALCOHOL 1.4 % OP SOLN: 1.0000 [drp] | Freq: Every day | OPHTHALMIC | Status: DC | PRN

## 2023-06-21 MED ORDER — PHENYLEPHRINE 80 MCG/ML (10ML) SYRINGE FOR IV PUSH (FOR BLOOD PRESSURE SUPPORT)
PREFILLED_SYRINGE | INTRAVENOUS | Status: AC
  Filled 2023-06-21: qty 10

## 2023-06-21 MED ORDER — POLYETHYLENE GLYCOL 3350 17 G PO PACK: 17.0000 g | PACK | Freq: Every day | ORAL | Status: DC | PRN

## 2023-06-21 MED ORDER — BUPIVACAINE-EPINEPHRINE (PF) 0.25% -1:200000 IJ SOLN
INTRAMUSCULAR | Status: DC | PRN
  Administered 2023-06-21: 20 mL

## 2023-06-21 MED ORDER — CEFAZOLIN SODIUM-DEXTROSE 2-4 GM/100ML-% IV SOLN
2.0000 g | Freq: Four times a day (QID) | INTRAVENOUS | Status: AC
  Administered 2023-06-21 – 2023-06-22 (×3): 2 g via INTRAVENOUS
  Filled 2023-06-21 (×3): qty 100

## 2023-06-21 MED ORDER — ESMOLOL HCL 100 MG/10ML IV SOLN
INTRAVENOUS | Status: DC | PRN
  Administered 2023-06-21: 30 mg via INTRAVENOUS

## 2023-06-21 MED ORDER — TRAMADOL HCL 50 MG PO TABS: 50.0000 mg | ORAL_TABLET | ORAL | Status: DC | PRN

## 2023-06-21 MED ORDER — MIDAZOLAM HCL 2 MG/2ML IJ SOLN
INTRAMUSCULAR | Status: AC
  Filled 2023-06-21: qty 2

## 2023-06-21 MED ORDER — PHENYLEPHRINE 80 MCG/ML (10ML) SYRINGE FOR IV PUSH (FOR BLOOD PRESSURE SUPPORT)
PREFILLED_SYRINGE | INTRAVENOUS | Status: DC | PRN
  Administered 2023-06-21 (×2): 80 ug via INTRAVENOUS

## 2023-06-21 MED ORDER — POLYETHYL GLYCOL-PROPYL GLYCOL 0.4-0.3 % OP GEL: 1.0000 | Freq: Every day | OPHTHALMIC | Status: DC | PRN

## 2023-06-21 MED ORDER — ONDANSETRON HCL 4 MG/2ML IJ SOLN: 4.0000 mg | Freq: Once | INTRAMUSCULAR | Status: DC | PRN

## 2023-06-21 MED ORDER — VASOPRESSIN 20 UNIT/ML IV SOLN
INTRAVENOUS | Status: AC
  Filled 2023-06-21: qty 1

## 2023-06-21 MED ORDER — BUPIVACAINE HCL (PF) 0.5 % IJ SOLN
INTRAMUSCULAR | Status: DC | PRN
  Administered 2023-06-21: 15 mL via PERINEURAL

## 2023-06-21 MED ORDER — BISACODYL 10 MG RE SUPP: 10.0000 mg | Freq: Every day | RECTAL | Status: DC | PRN

## 2023-06-21 MED ORDER — DEXAMETHASONE SODIUM PHOSPHATE 10 MG/ML IJ SOLN
INTRAMUSCULAR | Status: AC
  Filled 2023-06-21: qty 1

## 2023-06-21 MED ORDER — PROPOFOL 10 MG/ML IV BOLUS
INTRAVENOUS | Status: AC
  Filled 2023-06-21: qty 20

## 2023-06-21 MED ORDER — METHOCARBAMOL 1000 MG/10ML IJ SOLN: 500.0000 mg | Freq: Four times a day (QID) | INTRAVENOUS | Status: DC | PRN

## 2023-06-21 MED ORDER — ROCURONIUM BROMIDE 10 MG/ML (PF) SYRINGE
PREFILLED_SYRINGE | INTRAVENOUS | Status: AC
  Filled 2023-06-21: qty 10

## 2023-06-21 MED ORDER — AZELASTINE HCL 0.1 % NA SOLN: 1.0000 | Freq: Two times a day (BID) | NASAL | Status: DC | PRN

## 2023-06-21 MED ORDER — ACETAMINOPHEN 10 MG/ML IV SOLN
1000.0000 mg | Freq: Once | INTRAVENOUS | Status: DC | PRN
  Administered 2023-06-21: 1000 mg via INTRAVENOUS

## 2023-06-21 MED ORDER — ALBUTEROL SULFATE (2.5 MG/3ML) 0.083% IN NEBU: 2.5000 mg | INHALATION_SOLUTION | Freq: Four times a day (QID) | RESPIRATORY_TRACT | Status: DC | PRN

## 2023-06-21 MED ORDER — ONDANSETRON HCL 4 MG PO TABS: 4.0000 mg | ORAL_TABLET | Freq: Three times a day (TID) | ORAL | 1 refills | Status: AC | PRN

## 2023-06-21 MED ORDER — LISINOPRIL-HYDROCHLOROTHIAZIDE 20-12.5 MG PO TABS: 1.0000 | ORAL_TABLET | Freq: Every day | ORAL | Status: DC

## 2023-06-21 MED ORDER — SODIUM CHLORIDE 0.9 % IV SOLN: INTRAVENOUS | Status: DC

## 2023-06-21 MED ORDER — AMISULPRIDE (ANTIEMETIC) 5 MG/2ML IV SOLN: 10.0000 mg | Freq: Once | INTRAVENOUS | Status: DC | PRN

## 2023-06-21 MED ORDER — MENTHOL 3 MG MT LOZG: 1.0000 | LOZENGE | OROMUCOSAL | Status: DC | PRN

## 2023-06-21 MED ORDER — OXYCODONE HCL 5 MG PO TABS
5.0000 mg | ORAL_TABLET | ORAL | Status: DC | PRN
  Administered 2023-06-22: 10 mg via ORAL
  Filled 2023-06-21: qty 2

## 2023-06-21 MED ORDER — BUPIVACAINE LIPOSOME 1.3 % IJ SUSP
INTRAMUSCULAR | Status: DC | PRN
  Administered 2023-06-21: 10 mL via PERINEURAL

## 2023-06-21 MED ORDER — VASOPRESSIN 20 UNIT/ML IV SOLN
INTRAVENOUS | Status: DC | PRN
  Administered 2023-06-21 (×4): 1 [IU] via INTRAVENOUS
  Administered 2023-06-21: .5 [IU] via INTRAVENOUS
  Administered 2023-06-21: 1 [IU] via INTRAVENOUS
  Administered 2023-06-21: .5 [IU] via INTRAVENOUS

## 2023-06-21 MED ORDER — ALBUTEROL SULFATE HFA 108 (90 BASE) MCG/ACT IN AERS
INHALATION_SPRAY | RESPIRATORY_TRACT | Status: DC | PRN
  Administered 2023-06-21: 2 via RESPIRATORY_TRACT

## 2023-06-21 MED ORDER — EPHEDRINE 5 MG/ML INJ
INTRAVENOUS | Status: AC
  Filled 2023-06-21: qty 5

## 2023-06-21 MED ORDER — PHENOL 1.4 % MT LIQD: 1.0000 | OROMUCOSAL | Status: DC | PRN

## 2023-06-21 MED ORDER — IBUPROFEN 400 MG PO TABS
800.0000 mg | ORAL_TABLET | Freq: Three times a day (TID) | ORAL | Status: DC | PRN
  Administered 2023-06-21: 800 mg via ORAL
  Filled 2023-06-21: qty 2

## 2023-06-21 MED ORDER — DEXAMETHASONE SODIUM PHOSPHATE 10 MG/ML IJ SOLN
INTRAMUSCULAR | Status: DC | PRN
  Administered 2023-06-21: 4 mg via INTRAVENOUS

## 2023-06-21 MED ORDER — LACTATED RINGERS IV SOLN: INTRAVENOUS | Status: DC

## 2023-06-21 MED ORDER — OXYCODONE-ACETAMINOPHEN 5-325 MG PO TABS
1.0000 | ORAL_TABLET | ORAL | 0 refills | Status: AC | PRN
Start: 2023-06-21 — End: 2024-06-20

## 2023-06-21 MED ORDER — TRANEXAMIC ACID-NACL 1000-0.7 MG/100ML-% IV SOLN
INTRAVENOUS | Status: AC
  Filled 2023-06-21: qty 100

## 2023-06-21 MED ORDER — METHOCARBAMOL 500 MG PO TABS: 500.0000 mg | ORAL_TABLET | Freq: Three times a day (TID) | ORAL | 1 refills | Status: AC | PRN

## 2023-06-21 MED ORDER — FENTANYL CITRATE (PF) 100 MCG/2ML IJ SOLN
INTRAMUSCULAR | Status: DC | PRN
  Administered 2023-06-21 (×2): 50 ug via INTRAVENOUS

## 2023-06-21 MED ORDER — METOCLOPRAMIDE HCL 5 MG/ML IJ SOLN: 5.0000 mg | Freq: Three times a day (TID) | INTRAMUSCULAR | Status: DC | PRN

## 2023-06-21 MED ORDER — HYDROCHLOROTHIAZIDE 12.5 MG PO TABS
12.5000 mg | ORAL_TABLET | Freq: Every day | ORAL | Status: DC
  Administered 2023-06-22: 12.5 mg via ORAL
  Filled 2023-06-21: qty 1

## 2023-06-21 MED ORDER — FOLIC ACID 1 MG PO TABS
1.0000 mg | ORAL_TABLET | Freq: Every day | ORAL | Status: DC
  Administered 2023-06-21 – 2023-06-22 (×2): 1 mg via ORAL
  Filled 2023-06-21 (×2): qty 1

## 2023-06-21 MED ORDER — SUCCINYLCHOLINE CHLORIDE 200 MG/10ML IV SOSY
PREFILLED_SYRINGE | INTRAVENOUS | Status: DC | PRN
  Administered 2023-06-21: 80 mg via INTRAVENOUS

## 2023-06-21 MED ORDER — BUPIVACAINE-EPINEPHRINE 0.25% -1:200000 IJ SOLN
INTRAMUSCULAR | Status: AC
  Filled 2023-06-21: qty 1

## 2023-06-21 MED ORDER — CHLORHEXIDINE GLUCONATE 0.12 % MT SOLN
15.0000 mL | Freq: Once | OROMUCOSAL | Status: AC
  Administered 2023-06-21: 15 mL via OROMUCOSAL

## 2023-06-21 MED ORDER — DOCUSATE SODIUM 100 MG PO CAPS
100.0000 mg | ORAL_CAPSULE | Freq: Two times a day (BID) | ORAL | Status: DC
  Administered 2023-06-21 – 2023-06-22 (×2): 100 mg via ORAL
  Filled 2023-06-21 (×2): qty 1

## 2023-06-21 MED ORDER — LIDOCAINE 2% (20 MG/ML) 5 ML SYRINGE
INTRAMUSCULAR | Status: DC | PRN
  Administered 2023-06-21: 40 mg via INTRAVENOUS

## 2023-06-21 SURGICAL SUPPLY — 68 items
AID PSTN UNV HD RSTRNT DISP (MISCELLANEOUS) ×1
BAG COUNTER SPONGE SURGICOUNT (BAG) IMPLANT
BAG SPEC THK2 15X12 ZIP CLS (MISCELLANEOUS)
BAG SPNG CNTER NS LX DISP (BAG)
BAG ZIPLOCK 12X15 (MISCELLANEOUS) IMPLANT
BIT DRILL 1.6MX128 (BIT) IMPLANT
BIT DRILL 170X2.5X (BIT) IMPLANT
BIT DRL 170X2.5X (BIT) ×1
BLADE SAG 18X100X1.27 (BLADE) ×1 IMPLANT
COVER BACK TABLE 60X90IN (DRAPES) ×1 IMPLANT
COVER SURGICAL LIGHT HANDLE (MISCELLANEOUS) ×1 IMPLANT
DRAPE INCISE IOBAN 66X45 STRL (DRAPES) ×1 IMPLANT
DRAPE ORTHO SPLIT 77X108 STRL (DRAPES) ×2
DRAPE SHEET LG 3/4 BI-LAMINATE (DRAPES) ×1 IMPLANT
DRAPE SURG ORHT 6 SPLT 77X108 (DRAPES) ×2 IMPLANT
DRAPE TOP 10253 STERILE (DRAPES) ×1 IMPLANT
DRAPE U-SHAPE 47X51 STRL (DRAPES) ×1 IMPLANT
DRILL 2.5 (BIT) ×1
DRSG ADAPTIC 3X8 NADH LF (GAUZE/BANDAGES/DRESSINGS) ×1 IMPLANT
DURAPREP 26ML APPLICATOR (WOUND CARE) ×1 IMPLANT
ELECT BLADE TIP CTD 4 INCH (ELECTRODE) ×1 IMPLANT
ELECT NDL TIP 2.8 STRL (NEEDLE) ×1 IMPLANT
ELECT NEEDLE TIP 2.8 STRL (NEEDLE) ×1 IMPLANT
ELECT REM PT RETURN 15FT ADLT (MISCELLANEOUS) ×1 IMPLANT
EPI LT SZ 1 (Orthopedic Implant) ×1 IMPLANT
EPIPHYSIS LT SZ 1 (Orthopedic Implant) IMPLANT
FACESHIELD WRAPAROUND (MASK) ×1 IMPLANT
FACESHIELD WRAPAROUND OR TEAM (MASK) ×1 IMPLANT
GAUZE PAD ABD 8X10 STRL (GAUZE/BANDAGES/DRESSINGS) ×1 IMPLANT
GAUZE SPONGE 4X4 12PLY STRL (GAUZE/BANDAGES/DRESSINGS) ×1 IMPLANT
GLENOSPHERE DELTA XTEND LAT 38 (Miscellaneous) IMPLANT
GLOVE BIOGEL PI IND STRL 7.5 (GLOVE) ×1 IMPLANT
GLOVE BIOGEL PI IND STRL 8.5 (GLOVE) ×1 IMPLANT
GLOVE ORTHO TXT STRL SZ7.5 (GLOVE) ×1 IMPLANT
GLOVE SURG ORTHO 8.5 STRL (GLOVE) ×1 IMPLANT
GOWN STRL REUS W/ TWL XL LVL3 (GOWN DISPOSABLE) ×2 IMPLANT
GOWN STRL REUS W/TWL XL LVL3 (GOWN DISPOSABLE) ×2
KIT BASIN OR (CUSTOM PROCEDURE TRAY) ×1 IMPLANT
KIT TURNOVER KIT A (KITS) IMPLANT
MANIFOLD NEPTUNE II (INSTRUMENTS) ×1 IMPLANT
METAGLENE DELTA EXTEND (Trauma) IMPLANT
METAGLENE DXTEND (Trauma) ×1 IMPLANT
NDL MAYO CATGUT SZ4 TPR NDL (NEEDLE) IMPLANT
NEEDLE MAYO CATGUT SZ4 (NEEDLE) IMPLANT
NS IRRIG 1000ML POUR BTL (IV SOLUTION) ×1 IMPLANT
PACK SHOULDER (CUSTOM PROCEDURE TRAY) ×1 IMPLANT
PIN GUIDE 1.2 (PIN) IMPLANT
PIN GUIDE GLENOPHERE 1.5MX300M (PIN) IMPLANT
PIN METAGLENE 2.5 (PIN) IMPLANT
RESTRAINT HEAD UNIVERSAL NS (MISCELLANEOUS) ×1 IMPLANT
SCREW 4.5X24MM (Screw) ×1 IMPLANT
SCREW BN 24X4.5XLCK STRL (Screw) IMPLANT
SCREW LOCK 42 (Screw) IMPLANT
SLING ARM FOAM STRAP LRG (SOFTGOODS) IMPLANT
SPACER 38 PLUS 3 (Spacer) IMPLANT
SPIKE FLUID TRANSFER (MISCELLANEOUS) ×1 IMPLANT
SPONGE T-LAP 4X18 ~~LOC~~+RFID (SPONGE) IMPLANT
STEM DELTA DIA 10 HA (Stem) IMPLANT
STRIP CLOSURE SKIN 1/2X4 (GAUZE/BANDAGES/DRESSINGS) ×1 IMPLANT
SUT FIBERWIRE #2 38 T-5 BLUE (SUTURE) ×1
SUT MNCRL AB 4-0 PS2 18 (SUTURE) ×1 IMPLANT
SUT VIC AB 0 CT1 36 (SUTURE) ×1 IMPLANT
SUT VIC AB 0 CT2 27 (SUTURE) ×1 IMPLANT
SUT VIC AB 2-0 CT1 27 (SUTURE) ×1
SUT VIC AB 2-0 CT1 TAPERPNT 27 (SUTURE) ×1 IMPLANT
SUTURE FIBERWR #2 38 T-5 BLUE (SUTURE) ×1 IMPLANT
TAPE CLOTH SURG 4X10 WHT LF (GAUZE/BANDAGES/DRESSINGS) IMPLANT
TOWEL OR 17X26 10 PK STRL BLUE (TOWEL DISPOSABLE) ×1 IMPLANT

## 2023-06-21 NOTE — Anesthesia Procedure Notes (Signed)
Procedure Name: Intubation Date/Time: 06/21/2023 7:28 AM  Performed by: Sindy Guadeloupe, CRNAPre-anesthesia Checklist: Patient identified, Emergency Drugs available, Suction available, Patient being monitored and Timeout performed Patient Re-evaluated:Patient Re-evaluated prior to induction Oxygen Delivery Method: Circle system utilized Preoxygenation: Pre-oxygenation with 100% oxygen Induction Type: IV induction Ventilation: Mask ventilation without difficulty Laryngoscope Size: Mac and 4 Grade View: Grade I Tube type: Oral Tube size: 7.0 mm Number of attempts: 1 Airway Equipment and Method: Stylet Placement Confirmation: ETT inserted through vocal cords under direct vision, positive ETCO2 and breath sounds checked- equal and bilateral Secured at: 22 cm Tube secured with: Tape Dental Injury: Teeth and Oropharynx as per pre-operative assessment

## 2023-06-21 NOTE — Anesthesia Preprocedure Evaluation (Addendum)
Anesthesia Evaluation  Patient identified by MRN, date of birth, ID band Patient awake    Reviewed: Allergy & Precautions, NPO status , Patient's Chart, lab work & pertinent test results  Airway Mallampati: II  TM Distance: >3 FB Neck ROM: Full    Dental no notable dental hx.    Pulmonary asthma    Pulmonary exam normal        Cardiovascular hypertension, Pt. on medications Normal cardiovascular exam     Neuro/Psych negative neurological ROS  negative psych ROS   GI/Hepatic negative GI ROS, Neg liver ROS,,,  Endo/Other  negative endocrine ROS    Renal/GU negative Renal ROS     Musculoskeletal  (+) Arthritis ,    Abdominal  (+) + obese  Peds  Hematology negative hematology ROS (+)   Anesthesia Other Findings left shoulder osteoarthritis  Reproductive/Obstetrics                             Anesthesia Physical Anesthesia Plan  ASA: 2  Anesthesia Plan: Regional and General   Post-op Pain Management: Regional block*   Induction: Intravenous  PONV Risk Score and Plan: 3 and Ondansetron, Dexamethasone, Midazolam and Treatment may vary due to age or medical condition  Airway Management Planned: Oral ETT  Additional Equipment:   Intra-op Plan:   Post-operative Plan: Extubation in OR  Informed Consent: I have reviewed the patients History and Physical, chart, labs and discussed the procedure including the risks, benefits and alternatives for the proposed anesthesia with the patient or authorized representative who has indicated his/her understanding and acceptance.     Dental advisory given  Plan Discussed with: CRNA  Anesthesia Plan Comments:        Anesthesia Quick Evaluation

## 2023-06-21 NOTE — Op Note (Signed)
NAME: Anna Wilkins, Anna Wilkins MEDICAL RECORD NO: [id number] ACCOUNT NO: [id number] DATE OF BIRTH: Jul 09, 1956 FACILITY: [facility] LOCATION: [facility] PHYSICIAN: Almedia Balls. Ranell Patrick, MD  Operative Report   DATE OF PROCEDURE: 06/21/2023  PREOPERATIVE DIAGNOSIS:  Left shoulder end-stage arthritis.  POSTOPERATIVE DIAGNOSES: 1.  Left shoulder end-stage arthritis. 2.  As well as rotator cuff insufficiency.  PROCEDURE PERFORMED:  Left reverse shoulder replacement using DePuy Delta Xtend prosthesis with no subscap repair.  ATTENDING SURGEON:  Almedia Balls. Ranell Patrick, MD  ASSISTANT:  Konrad Felix Dixon, [place], who was scrubbed during the entire procedure and necessary for satisfactory completion of surgery.  ANESTHESIA:  General anesthesia was used plus interscalene block.  ESTIMATED BLOOD LOSS:  Less than 100 mL.  FLUID REPLACEMENT:  1500 mL crystalloid.  COUNTS:  Instrument counts correct.  COMPLICATIONS:  No complications.  ANTIBIOTICS:  Perioperative antibiotics were given.  INDICATIONS:  The patient is a 67 year old female who presents with worsening left shoulder pain due to end-stage arthritis bone-on-bone.  The patient also has had prior rotator cuff surgery and has rotator cuff insufficiency.  Having failed conservative  management, she presents now for operative treatment to eliminate pain and restore function.  Informed consent obtained.  DESCRIPTION OF PROCEDURE:  After an adequate level of anesthesia was achieved, the patient was positioned in the modified beach chair position.  Left shoulder correctly identified and sterile prep and drape performed.  Timeout called, verifying correct  patient and correct site. We entered the patient's shoulder using a standard deltopectoral approach, starting at the coracoid process, extending down to the anterior humerus.  Dissection down through subcutaneous tissues using Bovie.  Cephalic vein was  identified and taken laterally with the deltoid, pectoralis taken  medially.  Conjoined tendon identified and retracted medially.  Deep retractors were placed.  We then tenodesed the biceps in situ with 0 Vicryl figure-of-eight suture, incorporating part  of the pec tendon.  We then released the subscapularis remnant off the lesser tuberosity, tagging for protection of the axillary nerve.  We then released the inferior capsule.  We externally rotated the shoulder, delivering the humeral head out of the  wound.  Humeral head was devoid of cartilage.  We entered the proximal humerus with a 6 mm reamer, reaming up to a size 10.  We then took our 10 mm T-handled guide and resected the head at 20 degrees of retroversion with the oscillating saw.  We then  removed excess osteophytes off the medial humeral neck with a rongeur.  We then subluxed the humerus posteriorly, gaining good exposure of the glenoid face.  We removed the capsule and labrum, identifying glenoid devoid of cartilage.  We found our center  point for a guide pin, which we placed low on the glenoid, angling slightly inferiorly.  We then reamed the subchondral bone for a metaglene baseplate.  Next, we did our peripheral hand reamer and then drilled out our central peg hole.  We irrigated  thoroughly and then impacted our HA coated press-fit baseplate into position.  We placed a 42 screw inferiorly, a 24 screw superiorly, both with good purchase.  We locked those into the baseplate.  Due to the small diameter of the patient's glenoid, we  can only get 2 screws in.  We then placed our 38+0 standard glenosphere onto the baseplate and secured with a screwdriver.  Next, I did a finger sweep to make sure we had no soft tissue caught up in that bearing.  We then went to the humeral  side, we  reamed for the 1 left metaphysis and then trialed with the 10 stem, 1 left metaphysis set on the 0 setting and placed in 20 degrees of retroversion.  We then trialed with a 38+3 poly, placed that on the humeral tray, reduced the  shoulder and appropriate  soft tissue tensioning and balancing.  No instability noted.  We removed the trial components from the humeral side.  We irrigated thoroughly and then we used available bone graft from the humeral head with impaction grafting technique and impacted the  HA coated press fit 10 stem with the 1 left metaphysis again set on the 0 setting and impacted in 20 degrees of retroversion.  We had a very stable stem.  I selected the 38+3 poly and impacted that on the humeral tray and reduced the shoulder.  Nice  little pop as it reduced and appropriate conjoined tensioning and no instability.  Good range of motion, no impingement.  We irrigated again and resected the subscap remnant prior to closing the deltopectoral interval with 0 Vicryl suture followed by 2-0  Vicryl for subcutaneous closure and 4-0 Monocryl for skin.  Steri-Strips applied followed by sterile dressing.  The patient tolerated surgery well.   SHW D: 06/21/2023 8:50:25 am T: 06/21/2023 9:17:00 am  JOB: [id number]/ [id number]

## 2023-06-21 NOTE — Discharge Instructions (Signed)
Ice to the shoulder constantly.  Keep the incision covered and clean and dry for one week, then ok to get it wet in the shower. Please leave the incision open to air after one week  Do exercise as instructed several times per day.  DO NOT reach behind your back or push up out of a chair with the operative arm.  Use a sling while you are up and around for comfort, may remove while seated.  Keep pillow propped behind the operative elbow.  Follow up with Dr Ranell Patrick in two weeks in the office, call [phone] for appt  Call Dr Ranell Patrick (cell) at [phone] with any questions or concerns

## 2023-06-21 NOTE — Anesthesia Procedure Notes (Signed)
Anesthesia Regional Block: Interscalene brachial plexus block   Pre-Anesthetic Checklist: , timeout performed,  Correct Patient, Correct Site, Correct Laterality,  Correct Procedure, Correct Position, site marked,  Risks and benefits discussed,  Surgical consent,  Pre-op evaluation,  At surgeon's request and post-op pain management  Laterality: Left  Prep: chloraprep       Needles:  Injection technique: Single-shot  Needle Type: Echogenic Stimulator Needle     Needle Length: 9cm  Needle Gauge: 21     Additional Needles:   Procedures:,,,, ultrasound used (permanent image in chart),,    Narrative:  Start time: 06/21/2023 7:05 AM End time: 06/21/2023 7:15 AM Injection made incrementally with aspirations every 5 mL.  Performed by: Personally  Anesthesiologist: Leonides Grills, MD  Additional Notes: Functioning IV was confirmed and monitors were applied.  A timeout was performed. Sterile prep, hand hygiene and sterile gloves were used. A 90mm 21ga Arrow echogenic stimulator needle was used. Negative aspiration and negative test dose prior to incremental administration of local anesthetic. The patient tolerated the procedure well.  Ultrasound guidance: relevent anatomy identified, needle position confirmed, local anesthetic spread visualized around nerve(s), vascular puncture avoided.  Image printed for medical record.

## 2023-06-21 NOTE — Anesthesia Postprocedure Evaluation (Signed)
Anesthesia Post Note  Patient: Anna Wilkins  Procedure(s) Performed: REVERSE SHOULDER ARTHROPLASTY (Left: Shoulder)     Patient location during evaluation: PACU Anesthesia Type: Regional and General Level of consciousness: awake Pain management: pain level controlled Vital Signs Assessment: post-procedure vital signs reviewed and stable Respiratory status: spontaneous breathing, nonlabored ventilation and respiratory function stable Cardiovascular status: blood pressure returned to baseline and stable Postop Assessment: no apparent nausea or vomiting Anesthetic complications: no   No notable events documented.  Last Vitals:  Vitals:   06/21/23 1241 06/21/23 1403  BP: 123/83 114/69  Pulse: (!) 103 (!) 104  Resp: 17   Temp: 36.6 C 36.7 C  SpO2: 94% 93%    Last Pain:  Vitals:   06/21/23 1403  TempSrc: Oral  PainSc:                   P 

## 2023-06-21 NOTE — Brief Op Note (Signed)
06/21/2023  8:45 AM  PATIENT:  Anna Wilkins  67 y.o. female  PRE-OPERATIVE DIAGNOSIS:  left shoulder osteoarthritis, end stage  POST-OPERATIVE DIAGNOSIS:  left shoulder osteoarthritis, end stge  PROCEDURE:  Procedure(s) with comments: REVERSE SHOULDER ARTHROPLASTY (Left) - choice with interscalene block DePuy Delta Xtend with NO subscap repair  SURGEON:  Surgeons and Role:    Beverely Low, MD - Primary  PHYSICIAN ASSISTANT:   ASSISTANTS: Thea Gist, PA-C   ANESTHESIA:   regional and general  EBL:  20 mL   BLOOD ADMINISTERED:none  DRAINS: none   LOCAL MEDICATIONS USED:  MARCAINE     SPECIMEN:  No Specimen  DISPOSITION OF SPECIMEN:  N/A  COUNTS:  YES  TOURNIQUET:  * No tourniquets in log *  DICTATION: .Other Dictation: Dictation Number [id number]  PLAN OF CARE: Admit for overnight observation  PATIENT DISPOSITION:  PACU - hemodynamically stable.   Delay start of Pharmacological VTE agent (>24hrs) due to surgical blood loss or risk of bleeding: not applicable

## 2023-06-21 NOTE — Transfer of Care (Signed)
Immediate Anesthesia Transfer of Care Note  Patient: Anna Wilkins  Procedure(s) Performed: REVERSE SHOULDER ARTHROPLASTY (Left: Shoulder)  Patient Location: PACU  Anesthesia Type:General  Level of Consciousness: awake, alert , and patient cooperative  Airway & Oxygen Therapy: Patient Spontanous Breathing and Patient connected to face mask oxygen  Post-op Assessment: Report given to RN and Post -op Vital signs reviewed and stable  Post vital signs: Reviewed and stable  Last Vitals:  Vitals Value Taken Time  BP 135/76 06/21/23 0902  Temp    Pulse 104 06/21/23 0904  Resp 16 06/21/23 0904  SpO2 100 % 06/21/23 0904  Vitals shown include unfiled device data.  Last Pain:  Vitals:   06/21/23 0610  TempSrc: Oral         Complications: No notable events documented.

## 2023-06-21 NOTE — Interval H&P Note (Signed)
History and Physical Interval Note:  06/21/2023 7:13 AM  Anna Wilkins  has presented today for surgery, with the diagnosis of left shoulder osteoarthritis.  The various methods of treatment have been discussed with the patient and family. After consideration of risks, benefits and other options for treatment, the patient has consented to  Procedure(s) with comments: REVERSE SHOULDER ARTHROPLASTY (Left) - choice with interscalene block as a surgical intervention.  The patient's history has been reviewed, patient examined, no change in status, stable for surgery.  I have reviewed the patient's chart and labs.  Questions were answered to the patient's satisfaction.     [name]

## 2023-06-22 DIAGNOSIS — M19012 Primary osteoarthritis, left shoulder: Secondary | ICD-10-CM | POA: Diagnosis not present

## 2023-06-22 NOTE — Evaluation (Signed)
Occupational Therapy Evaluation Patient Details Name: Anna Wilkins MRN: [id number] DOB: 18-Nov-1955 Today's Date: 06/22/2023   History of Present Illness 67 y.o. female presenting with end-stage osteoarthritis s/p elective LT reverse shoulder replacement on 06/20/23. PMHx significant for previous shoulder surgery x2 including RT rTSA, TKA x2, HTN, asthma and arthritis.   Clinical Impression   Pt is a 67 year old female, s/p reverse shoulder replacement without functional use of LT non-dominant upper extremity secondary to effects of surgery and interscalene block and shoulder precautions. Therapist provided education and instruction to patient in regards to exercises, precautions, positioning, donning upper extremity clothing and bathing while maintaining shoulder precautions, ice and edema management and donning/doffing sling. Patient verbalized understanding and demonstrated as needed. Patient was unable to don shirt secondary to peripheral IV still in place however patient was able to verbalize understanding of method for donning and doffing both overhead and open front shirts as well as verbalize where patient may need a little assistance with lower body dressing and bathing.  Needed assistance to donn sling and provided with instruction on compensatory strategies to perform ADLs. Patient limited by decreased ROM in LT shoulder so therefore will need some form of assistance at home. Patient will have her spouse 24/7 and verbalized and/or demonstrated understanding to all instruction. Patient to follow up with MD for further therapy needs.         If plan is discharge home, recommend the following: A little help with bathing/dressing/bathroom;Assistance with cooking/housework;Assist for transportation    Functional Status Assessment  Patient has had a recent decline in their functional status and demonstrates the ability to make significant improvements in function in a reasonable and predictable  amount of time.  Equipment Recommendations  None recommended by OT    Recommendations for Other Services       Precautions / Restrictions Precautions Precautions: Shoulder Shoulder Interventions: At all times;Off for dressing/bathing/exercises Precaution Booklet Issued: Yes (comment) Required Braces or Orthoses: Sling Restrictions Weight Bearing Restrictions: Yes LUE Weight Bearing: Non weight bearing Other Position/Activity Restrictions: Sling at all times except ADL/exercise Yes  Non weight bearing Yes  AROM elbow, wrist and hand to tolerance Yes  PROM of shoulder No  AROM of shoulder No  OT Consult Special Instructions ok for gentle ADLs hand to face      Mobility Bed Mobility               General bed mobility comments: Patient was standing in room his OT entered.  Patient did verbalize understanding to bed mobility while nonweightbearing and in sling with left upper extremity including no leveraging of elbow to help push herself out of bed.    Transfers Overall transfer level: Modified independent                        Balance Overall balance assessment: History of Falls, Modified Independent, Mild deficits observed, not formally tested                                         ADL either performed or assessed with clinical judgement   ADL Overall ADL's : Needs assistance/impaired Eating/Feeding: Modified independent Eating/Feeding Details (indicate cue type and reason): Patient instructed in approved use of her left upper extremity for light feeding and grooming while adhering to shoulder precautions. Grooming: Wash/dry hands;Standing;Modified independent;Adhering to UE precautions  Upper Body Bathing: Supervision/ safety;Contact guard assist;Cueing for compensatory techniques;Adhering to UE precautions Upper Body Bathing Details (indicate cue type and reason): Patient instructed in both dangle and seesaw method and cautioned not to  shower until cleared by surgeon.  Recommended supervision from spouse with for shower for safety. Lower Body Bathing: Modified independent;Sitting/lateral leans;Sit to/from stand   Upper Body Dressing : Minimal assistance;Standing;Adhering to UE precautions   Lower Body Dressing: Minimal assistance;Sit to/from stand;Cueing for compensatory techniques Lower Body Dressing Details (indicate cue type and reason): Patient verbalized understanding to probable need of assistance behind left shoulder to straighten out underwear and pants and stated her spouse can do this with her. Toilet Transfer: Independent;Ambulation Toilet Transfer Details (indicate cue type and reason): Patient standing and ambulating around room with sling in place independently as OT entered room. Toileting- Clothing Manipulation and Hygiene: Sit to/from stand;Sitting/lateral lean;Modified independent Toileting - Clothing Manipulation Details (indicate cue type and reason): Patient may need a little assist with clothing management but otherwise mod I for hygiene.     Functional mobility during ADLs: Modified independent       Vision Ability to See in Adequate Light: 0 Adequate Vision Assessment?: Wears glasses for reading     Perception         Praxis         Pertinent Vitals/Pain Pain Assessment Pain Assessment: 0-10 Pain Score: 5  Pain Location: Lower back, chonic. Pain Descriptors / Indicators:  (Aggravating) Pain Intervention(s): Limited activity within patient's tolerance, Monitored during session, Repositioned     Extremity/Trunk Assessment Upper Extremity Assessment Upper Extremity Assessment: Right hand dominant;LUE deficits/detail LUE Deficits / Details: moving hand/wrist/forearm well. Sensation beginning to return. LUE: Unable to fully assess due to immobilization LUE Sensation: decreased light touch       Cervical / Trunk Assessment Cervical / Trunk Assessment: Back Surgery   Communication  Communication Communication: No apparent difficulties   Cognition Arousal: Alert Behavior During Therapy: WFL for tasks assessed/performed Overall Cognitive Status: Within Functional Limits for tasks assessed                                       General Comments       Exercises Other Exercises Other Exercises: Patient provided with handout and able to demonstrate hand, wrist, forearm, elbow range of motion independently and verbalized understanding to frequency per day.   Shoulder Instructions Shoulder Instructions Donning/doffing shirt without moving shoulder: Minimal assistance;Patient able to independently direct caregiver Method for sponge bathing under operated UE: Patient able to independently direct caregiver;Min-guard Donning/doffing sling/immobilizer: Minimal assistance;Patient able to independently direct caregiver Correct positioning of sling/immobilizer: Independent;Patient able to independently direct caregiver ROM for elbow, wrist and digits of operated UE: Independent;Patient able to independently direct caregiver Sling wearing schedule (on at all times/off for ADL's): Independent;Patient able to independently direct caregiver Proper positioning of operated UE when showering: Independent;Patient able to independently direct caregiver Dressing change: Maximal assistance;Patient able to independently direct caregiver Positioning of UE while sleeping: Independent;Patient able to independently direct caregiver    Home Living Family/patient expects to be discharged to:: Private residence Living Arrangements: Spouse/significant other Available Help at Discharge: Available 24 hours/day;Family Type of Home: House Home Access: Stairs to enter [employer] of Steps: 3 Entrance Stairs-Rails: Right;Left Home Layout: Two level Alternate Level Stairs-Number of Steps: 6 Alternate Level Stairs-Rails: Right Bathroom Shower/Tub: Scientist, research (life sciences):  Standard     Home Equipment: Rolling Walker (2 wheels);Rollator (4 wheels);Cane - quad;Cane - single point;BSC/3in1          Prior Functioning/Environment Prior Level of Function : Independent/Modified Independent;Driving;History of Falls (last six months);Working/employed             Mobility Comments: 1 fall in a parking lot, fell on face. Independent ADLs Comments: Work for [employer].        OT Problem List: Decreased range of motion;Decreased strength;Pain      OT Treatment/Interventions:      OT Goals(Current goals can be found in the care plan section) Acute Rehab OT Goals OT Goal Formulation: All assessment and education complete, DC therapy Potential to Achieve Goals: Good ADL Goals Pt/caregiver will Perform Home Exercise Program: Left upper extremity;With written HEP provided;Increased ROM (Hand, wrist, forearm, elbow only) Additional ADL Goal #1: Pt will demonstrate UE/LE dressing, donning/doffing of sling, correct positioning of LT UE, and compensatory strategies for LT axilla hygiene, as well as use of Ice packs, while correctly following all shoulder post-op precautions/restrictions.  OT Frequency:      Co-evaluation              AM-PAC OT "6 Clicks" Daily Activity     Outcome Measure Help from another person eating meals?: None Help from another person taking care of personal grooming?: None Help from another person toileting, which includes using toliet, bedpan, or urinal?: A Little Help from another person bathing (including washing, rinsing, drying)?: A Little Help from another person to put on and taking off regular upper body clothing?: A Little Help from another person to put on and taking off regular lower body clothing?: A Little 6 Click Score: 20   End of Session Equipment Utilized During Treatment:  (sling) Nurse Communication: Other (comment) (OT completed and patient ready from OT perspective for discharge)  Activity  Tolerance: Patient tolerated treatment well Patient left: in chair  OT Visit Diagnosis: History of falling (Z91.81);Muscle weakness (generalized) (M62.81);Pain Pain - part of body:  (lower back)                Time: 1610-9604 OT Time Calculation (min): 27 min Charges:  OT General Charges $OT Visit: 1 Visit OT Evaluation $OT Eval Low Complexity: 1 Low OT Treatments $Self Care/Home Management : 8-22 mins  Victorino Dike, OT Acute Rehab Services Office: [phone] 06/22/2023  Theodoro Clock 06/22/2023, 1:34 PM

## 2023-06-22 NOTE — Progress Notes (Signed)
Orthopedics Progress Note  Subjective: Patient with no pain this AM  Objective:  Vitals:   06/22/23 0154 06/22/23 0623  BP: 116/69 124/68  Pulse: 96 85  Resp: 17 19  Temp: 98.1 F (36.7 C) 98.1 F (36.7 C)  SpO2: 94% 93%    General: Awake and alert  Musculoskeletal: Left shoulder dressing changed, wound benign Neurovascularly intact  Lab Results  Component Value Date   WBC 8.0 06/14/2023   HGB 13.0 06/14/2023   HCT 38.9 06/14/2023   MCV 99.5 06/14/2023   PLT 197 06/14/2023       Component Value Date/Time   NA 135 06/14/2023 1314   K 3.4 (L) 06/14/2023 1314   CL 99 06/14/2023 1314   CO2 26 06/14/2023 1314   GLUCOSE 98 06/14/2023 1314   BUN 22 06/14/2023 1314   CREATININE 0.74 06/14/2023 1314   CALCIUM 8.9 06/14/2023 1314   GFRNONAA >60 06/14/2023 1314    No results found for: "INR", "PROTIME"  Assessment/Plan: POD #1 s/p Procedure(s): REVERSE SHOULDER ARTHROPLASTY Stable after TSA left shoulder  Home after therapy Follow up in two weeks  Almedia Balls. Ranell Patrick, MD 06/22/2023 7:51 AM

## 2023-06-22 NOTE — Discharge Summary (Signed)
In most cases prophylactic antibiotics for Dental procdeures after total joint surgery are not necessary.  Exceptions are as follows:  1. History of prior total joint infection  2. Severely immunocompromised (Organ Transplant, cancer chemotherapy, Rheumatoid biologic meds such as Humera)  3. Poorly controlled diabetes (A1C &gt; 8.0, blood glucose over 200)  If you have one of these conditions, contact your surgeon for an antibiotic prescription, prior to your dental procedure. Orthopedic Discharge Summary        Physician Discharge Summary  Patient ID: Anna Wilkins MRN: [id number] DOB/AGE: 1955-12-03 67 y.o.  Admit date: 06/21/2023 Discharge date: 06/22/2023   Procedures:  Procedure(s) (LRB): REVERSE SHOULDER ARTHROPLASTY (Left)  Attending Physician:  Dr. Malon Kindle  Admission Diagnoses:   Left shoulder OA, end stage  Discharge Diagnoses:  same   Past Medical History:  Diagnosis Date   Anemia    Arthritis    Asthma    Complication of anesthesia    Dyspnea    Hypertension    PONV (postoperative nausea and vomiting)     PCP: Arlie Solomons, MD   Discharged Condition: good  Hospital Course:  Patient underwent the above stated procedure on 06/21/2023. Patient tolerated the procedure well and brought to the recovery room in good condition and subsequently to the floor. Patient had an uncomplicated hospital course and was stable for discharge.   Disposition: Discharge disposition: 01-Home or Self Care      with follow up in 2 weeks    Follow-up Information     Beverely Low, MD. Call in 2 week(s).   Specialty: Orthopedic Surgery Why: call [phone] for appt Contact information: [address] [place] [address] [phone]                 Dental Antibiotics:  In most cases prophylactic antibiotics for Dental procdeures after total joint surgery are not necessary.  Exceptions are as follows:  1. History of prior  total joint infection  2. Severely immunocompromised (Organ Transplant, cancer chemotherapy, Rheumatoid biologic meds such as Humera)  3. Poorly controlled diabetes (A1C &gt; 8.0, blood glucose over 200)  If you have one of these conditions, contact your surgeon for an antibiotic prescription, prior to your dental procedure.  Discharge Instructions     Call MD / Call 911   Complete by: As directed    If you experience chest pain or shortness of breath, CALL 911 and be transported to the hospital emergency room.  If you develope a fever above 101 F, pus (white drainage) or increased drainage or redness at the wound, or calf pain, call your surgeon's office.   Constipation Prevention   Complete by: As directed    Drink plenty of fluids.  Prune juice may be helpful.  You may use a stool softener, such as Colace (over the counter) 100 mg twice a day.  Use MiraLax (over the counter) for constipation as needed.   Diet - low sodium heart healthy   Complete by: As directed    Increase activity slowly as tolerated   Complete by: As directed    Post-operative opioid taper instructions:   Complete by: As directed    POST-OPERATIVE OPIOID TAPER INSTRUCTIONS: It is important to wean off of your opioid medication as soon as possible. If you do not need pain medication after your surgery it is ok to stop day one. Opioids include: Codeine, Hydrocodone(Norco, Vicodin), Oxycodone(Percocet, oxycontin) and hydromorphone amongst others.  Long term  and even short term use of opiods can cause: Increased pain response Dependence Constipation Depression Respiratory depression And more.  Withdrawal symptoms can include Flu like symptoms Nausea, vomiting And more Techniques to manage these symptoms Hydrate well Eat regular healthy meals Stay active Use relaxation techniques(deep breathing, meditating, yoga) Do Not substitute Alcohol to help with tapering If you have been on opioids for less than  two weeks and do not have pain than it is ok to stop all together.  Plan to wean off of opioids This plan should start within one week post op of your joint replacement. Maintain the same interval or time between taking each dose and first decrease the dose.  Cut the total daily intake of opioids by one tablet each day Next start to increase the time between doses. The last dose that should be eliminated is the evening dose.          Allergies as of 06/22/2023       Reactions   Codeine Nausea And Vomiting   Penicillins Nausea And Vomiting   Sulfa Antibiotics Rash        Medication List     TAKE these medications    albuterol 108 (90 Base) MCG/ACT inhaler Commonly known as: VENTOLIN HFA Inhale 2 puffs into the lungs every 6 (six) hours as needed for shortness of breath or wheezing.   azelastine 0.1 % nasal spray Commonly known as: ASTELIN Place 1 spray into the nose 2 (two) times daily as needed for rhinitis or allergies.   Etanercept 25 MG/0.5ML Soln Inject 50 mg into the skin once a week.   folic acid 1 MG tablet Commonly known as: FOLVITE Take 1 mg by mouth daily.   ibuprofen 800 MG tablet Commonly known as: ADVIL Take 800 mg by mouth 3 (three) times daily as needed for moderate pain or mild pain.   lisinopril-hydrochlorothiazide 20-12.5 MG tablet Commonly known as: ZESTORETIC Take 1 tablet by mouth daily.   methocarbamol 500 MG tablet Commonly known as: ROBAXIN Take 1 tablet (500 mg total) by mouth every 8 (eight) hours as needed for muscle spasms.   methotrexate 2.5 MG tablet Commonly known as: RHEUMATREX Take 22.5 mg by mouth once a week.   ondansetron 4 MG tablet Commonly known as: Zofran Take 1 tablet (4 mg total) by mouth every 8 (eight) hours as needed for refractory nausea / vomiting, vomiting or nausea.   oxyCODONE-acetaminophen 5-325 MG tablet Commonly known as: Percocet Take 1 tablet by mouth every 4 (four) hours as needed for severe  pain.   Systane 0.4-0.3 % Gel ophthalmic gel Generic drug: Polyethyl Glycol-Propyl Glycol Place 1 Application into both eyes daily as needed (dry eyes).   traMADol 50 MG tablet Commonly known as: ULTRAM Take 50 mg by mouth every 4 (four) hours as needed for moderate pain or severe pain.          Signed: [name] 06/22/2023, 7:53 AM  [facility] is now [facility] [address], [address], [place], [place] [address] Phone: [phone] Facebook  Instagram  [employer]

## 2023-06-24 ENCOUNTER — Encounter (HOSPITAL_COMMUNITY): Payer: Self-pay | Admitting: Orthopedic Surgery
